# Patient Record
Sex: Female | Born: 1990 | Race: White | Hispanic: No | Marital: Single | Smoking: Former smoker
Health system: Southern US, Community
[De-identification: ages and names within clinical notes are randomized; demographics above are authoritative.]

## PROBLEM LIST (undated history)

## (undated) DIAGNOSIS — F329 Major depressive disorder, single episode, unspecified: Secondary | ICD-10-CM

## (undated) DIAGNOSIS — J309 Allergic rhinitis, unspecified: Secondary | ICD-10-CM

## (undated) DIAGNOSIS — F410 Panic disorder [episodic paroxysmal anxiety] without agoraphobia: Secondary | ICD-10-CM

## (undated) DIAGNOSIS — F32A Depression, unspecified: Secondary | ICD-10-CM

## (undated) DIAGNOSIS — F429 Obsessive-compulsive disorder, unspecified: Secondary | ICD-10-CM

## (undated) DIAGNOSIS — F419 Anxiety disorder, unspecified: Secondary | ICD-10-CM

## (undated) DIAGNOSIS — F909 Attention-deficit hyperactivity disorder, unspecified type: Secondary | ICD-10-CM

## (undated) HISTORY — DX: Depression, unspecified: F32.A

## (undated) HISTORY — DX: Major depressive disorder, single episode, unspecified: F32.9

## (undated) HISTORY — DX: Obsessive-compulsive disorder, unspecified: F42.9

## (undated) HISTORY — DX: Allergic rhinitis, unspecified: J30.9

## (undated) HISTORY — DX: Attention-deficit hyperactivity disorder, unspecified type: F90.9

---
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
## 2015-04-03 LAB — HM PAP SMEAR

## 2015-05-17 ENCOUNTER — Emergency Department
Admission: EM | Admit: 2015-05-17 | Discharge: 2015-05-17 | Disposition: A | Discharge status: Home / Self Care | Payer: BLUE CROSS/BLUE SHIELD | Attending: Emergency Medicine | Admitting: Emergency Medicine

## 2015-05-17 ENCOUNTER — Encounter: Payer: Self-pay | Admitting: Emergency Medicine

## 2015-05-17 DIAGNOSIS — Z880 Allergy status to penicillin: Secondary | ICD-10-CM | POA: Diagnosis not present

## 2015-05-17 DIAGNOSIS — R079 Chest pain, unspecified: Secondary | ICD-10-CM | POA: Diagnosis present

## 2015-05-17 DIAGNOSIS — F410 Panic disorder [episodic paroxysmal anxiety] without agoraphobia: Secondary | ICD-10-CM

## 2015-05-17 DIAGNOSIS — R200 Anesthesia of skin: Secondary | ICD-10-CM | POA: Insufficient documentation

## 2015-05-17 DIAGNOSIS — Z720 Tobacco use: Secondary | ICD-10-CM | POA: Insufficient documentation

## 2015-05-17 HISTORY — DX: Panic disorder (episodic paroxysmal anxiety): F41.0

## 2015-05-17 HISTORY — DX: Anxiety disorder, unspecified: F41.9

## 2015-05-17 MED ORDER — PROPRANOLOL HCL 20 MG PO TABS: 20.0000 mg | ORAL_TABLET | Freq: Three times a day (TID) | ORAL | Status: DC

## 2015-05-17 MED ORDER — PROPRANOLOL HCL 20 MG PO TABS
20.0000 mg | ORAL_TABLET | Freq: Once | ORAL | Status: AC
  Administered 2015-05-17: 20 mg via ORAL
  Filled 2015-05-17: qty 1

## 2015-05-17 NOTE — ED Notes (Signed)
MD at bedside. 

## 2015-05-17 NOTE — ED Notes (Signed)
Pt states that she is feeling much better, she denies pain and other c/o at time of d/c.

## 2015-05-17 NOTE — ED Notes (Signed)
Called and talked with Nicole Wade in pharmacy - they will be sending the propanolol

## 2015-05-17 NOTE — ED Provider Notes (Signed)
[facility] Emergency Department Provider Note  ____________________________________________  Time seen: 6:00 AM  I have reviewed the triage vital signs and the nursing notes.   HISTORY  Chief Complaint Chest Pain and Numbness    HPI Nicole Wade is a 24 y.o. female is brought to the ED for chest pain that started around 4:00 AM. 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 She has a long-standing history of anxiety that is very severe, she takes Klonopin and Xanax every day. She is also recently had a change in her hormonal birth control from Depakote to oral birth control pills. The chest pain is intermittent and sharp, lasting only a few seconds at a time. Nonradiating, not pleuritic or exertional. No diaphoresis vomiting or syncope. At the onset of the symptoms, she felt flushed and very energetic and restless.  She's been working night shifts at [employer] since July. She says that she has not had a day off in 5 weeks and worked every night since then. She also reports they worked very hard and she is frequently stressed about the amount of work she has. In order to make it through her shift and perform adequately, she is regularly been taking a weight loss supplement that contains multiple stimulants including caffeine, and alpha agonists, and yohimbine.     Past Medical History  Diagnosis Date  . Anxiety   . Panic disorder      There are no active problems to display for this patient.    Past Surgical History  Procedure Laterality Date  . Tooth extraction       Current Outpatient Rx  Name  Route  Sig  Dispense  Refill  . propranolol (INDERAL) 20 MG tablet   Oral   Take 1 tablet (20 mg total) by mouth 3 (three) times daily.   21 tablet   0    Klonopin, Xanax   Allergies Azithromycin and Penicillins   No family history on  file.  Social History Social History  Substance Use Topics  . Smoking status: Current Every Day Smoker -- 1.00 packs/day    Types: Cigarettes  . Smokeless tobacco: None  . Alcohol Use: Yes    Review of Systems  Constitutional:   No fever or chills. No weight changes Eyes:   No blurry vision or double vision.  ENT:   No sore throat. Cardiovascular:   Positive chest pain. Respiratory:   No dyspnea or cough. Gastrointestinal:   Negative for abdominal pain, vomiting and diarrhea.  No BRBPR or melena. Genitourinary:   Negative for dysuria, urinary retention, bloody urine, or difficulty urinating. Musculoskeletal:   Negative for back pain. No joint swelling or pain. Skin:   Negative for rash. Neurological:   Negative for headaches, focal weakness or numbness. Psychiatric:  Positive anxiety Endocrine:  No hot/cold intolerance, changes in energy, or sleep difficulty.  10-point ROS otherwise negative.  ____________________________________________   PHYSICAL EXAM:  VITAL SIGNS: ED Triage Vitals  Enc Vitals Group     BP 05/17/15 0546 122/100 mmHg     Pulse Rate 05/17/15 0546 85     Resp 05/17/15 0546 20     Temp 05/17/15 0546 98.1 F (36.7 C)     Temp Source 05/17/15 0546 Oral     SpO2 05/17/15 0552 99 %     Weight 05/17/15 0546 190 lb (86.183 kg)  Height 05/17/15 0546  (1.549 m)     Head Cir --      Peak Flow --      Pain Score 05/17/15 0547 5     Pain Loc --      Pain Edu? --      Excl. in GC? --      Constitutional:   Alert and oriented. Well appearing and in no distress. Eyes:   No scleral icterus. No conjunctival pallor. PERRL. EOMI ENT   Head:   Normocephalic and atraumatic.   Nose:   No congestion/rhinnorhea. No septal hematoma   Mouth/Throat:   MMM, no pharyngeal erythema. No peritonsillar mass. No uvula shift.   Neck:   No stridor. No SubQ emphysema. No meningismus. Hematological/Lymphatic/Immunilogical:   No cervical  lymphadenopathy. Cardiovascular:   RRR. Normal and symmetric distal pulses are present in all extremities. No murmurs, rubs, or gallops. Respiratory:   Normal respiratory effort without tachypnea nor retractions. Breath sounds are clear and equal bilaterally. No wheezes/rales/rhonchi. Gastrointestinal:   Soft and nontender. No distention. There is no CVA tenderness.  No rebound, rigidity, or guarding. Genitourinary:   deferred Musculoskeletal:   Nontender with normal range of motion in all extremities. No joint effusions.  No lower extremity tenderness.  No edema. Neurologic:   Normal speech and language.  CN 2-10 normal. Motor grossly intact. No pronator drift.  Normal gait. No gross focal neurologic deficits are appreciated.  Skin:    Skin is warm, dry and intact. No rash noted.  No petechiae, purpura, or bullae. Psychiatric:   Mood and affect are normal. Speech and behavior are normal. Patient exhibits appropriate insight and judgment.  ____________________________________________    LABS (pertinent positives/negatives) (all labs ordered are listed, but only abnormal results are displayed) Labs Reviewed - No data to display ____________________________________________   EKG  Interpreted by me Normal sinus rhythm rate of 85. Normal axis intervals QRS ST segments and T waves.  ____________________________________________    RADIOLOGY    ____________________________________________   PROCEDURES   ____________________________________________   INITIAL IMPRESSION / ASSESSMENT AND PLAN / ED COURSE  Pertinent labs & imaging results that were available during my care of the patient were reviewed by me and considered in my medical decision making (see chart for details).  Patient presents with anxiety attack. She likely had an adrenaline surge as well which was likely potentiated from her stimulant use. Patient refuses labs at this time. EKG is unremarkable. Exam is  reassuring. At a very low suspicion for ACS PE TAD pneumothorax carditis mediastinitis pneumonia or sepsis. No further workup necessary. We'll give her propranolol to help in addition to the Xanax she artery took. She'll follow-up with her primary care doctor within a week.     ____________________________________________   FINAL CLINICAL IMPRESSION(S) / ED DIAGNOSES  Final diagnoses:  Anxiety attack      Sharman Cheek, MD 05/17/15 [phone]

## 2015-05-17 NOTE — ED Notes (Signed)
Pt presents to ED with midsternal chest pain with arm and hand numbness, left arm pain, and left leg numbness that started about 4 am. Pt states she has a hx of anxiety and took xanax while at work when her symptoms first started but states it has not helped yet. Chest pain reproducible with palpation. Pt currently tachypneic and anxious.

## 2015-05-17 NOTE — Discharge Instructions (Signed)
You were seen in the emergency room tonight for a severe anxiety attack. This is related to your demanding work schedule and exhaustion. Please try to increase your time off and take regular breaks throughout your work shifts.  In addition to your current medicines, take propranolol as needed for severe anxiety attacks.  Panic Attacks Panic attacks are sudden, short-livedsurges of severe anxiety, fear, or discomfort. They may occur for no reason when you are relaxed, when you are anxious, or when you are sleeping. Panic attacks may occur for a number of reasons:   Healthy people occasionally have panic attacks in extreme, life-threatening situations, such as war or natural disasters. Normal anxiety is a protective mechanism of the body that helps [place] react to danger (fight or flight response).  Panic attacks are often seen with anxiety disorders, such as panic disorder, social anxiety disorder, generalized anxiety disorder, and phobias. Anxiety disorders cause excessive or uncontrollable anxiety. They may interfere with your relationships or other life activities.  Panic attacks are sometimes seen with other mental illnesses, such as depression and posttraumatic stress disorder.  Certain medical conditions, prescription medicines, and drugs of abuse can cause panic attacks. SYMPTOMS  Panic attacks start suddenly, peak within 20 minutes, and are accompanied by four or more of the following symptoms:  Pounding heart or fast heart rate (palpitations).  Sweating.  Trembling or shaking.  Shortness of breath or feeling smothered.  Feeling choked.  Chest pain or discomfort.  Nausea or strange feeling in your stomach.  Dizziness, light-headedness, or feeling like you will faint.  Chills or hot flushes.  Numbness or tingling in your lips or hands and feet.  Feeling that things are not real or feeling that you are not yourself.  Fear of losing control or going crazy.  Fear of  dying. Some of these symptoms can mimic serious medical conditions. For example, you may think you are having a heart attack. Although panic attacks can be very scary, they are not life threatening. DIAGNOSIS  Panic attacks are diagnosed through an assessment by your health care provider. Your health care provider will ask questions about your symptoms, such as where and when they occurred. Your health care provider will also ask about your medical history and use of alcohol and drugs, including prescription medicines. Your health care provider may order blood tests or other studies to rule out a serious medical condition. Your health care provider may refer you to a mental health professional for further evaluation. TREATMENT   Most healthy people who have one or two panic attacks in an extreme, life-threatening situation will not require treatment.  The treatment for panic attacks associated with anxiety disorders or other mental illness typically involves counseling with a mental health professional, medicine, or a combination of both. Your health care provider will help determine what treatment is best for you.  Panic attacks due to physical illness usually go away with treatment of the illness. If prescription medicine is causing panic attacks, talk with your health care provider about stopping the medicine, decreasing the dose, or substituting another medicine.  Panic attacks due to alcohol or drug abuse go away with abstinence. Some adults need professional help in order to stop drinking or using drugs. HOME CARE INSTRUCTIONS   Take all medicines as directed by your health care provider.   Schedule and attend follow-up visits as directed by your health care provider. It is important to keep all your appointments. SEEK MEDICAL CARE IF:  You are not  able to take your medicines as prescribed.  Your symptoms do not improve or get worse. SEEK IMMEDIATE MEDICAL CARE IF:   You experience  panic attack symptoms that are different than your usual symptoms.  You have serious thoughts about hurting yourself or others.  You are taking medicine for panic attacks and have a serious side effect. MAKE SURE YOU:  Understand these instructions.  Will watch your condition.  Will get help right away if you are not doing well or get worse. Document Released: 08/10/2005 Document Revised: 08/15/2013 Document Reviewed: 03/24/2013 [facility] Patient Information 2015 [place], [place]. This information is not intended to replace advice given to you by your health care provider. Make sure you discuss any questions you have with your health care provider.

## 2015-05-17 NOTE — ED Notes (Signed)
Pt c/o mid sternal chest pain onset 2 hours ago while at work.  She reports that she was using a piece of equipment at work and has had the same pain once in the past while using this equipment.   Pt also reports tingling sensation in left arm that started at the same time,   She had some tingling in her left leg as well but only as she arrived to the room and that has subsided now.  Pain is reproducible to palpation and movement.  Pt also reports hx of severe anxiety, but she reports this is not her typical anxiety symptoms- she has taken 1 mg of xanax PTA with no relief of pain.

## 2017-02-07 ENCOUNTER — Emergency Department
Admission: EM | Admit: 2017-02-07 | Discharge: 2017-02-07 | Disposition: A | Discharge status: Home / Self Care | Payer: Managed Care, Other (non HMO) | Attending: Student in an Organized Health Care Education/Training Program | Admitting: Student in an Organized Health Care Education/Training Program

## 2017-02-07 ENCOUNTER — Encounter: Payer: Self-pay | Admitting: Emergency Medicine

## 2017-02-07 DIAGNOSIS — F419 Anxiety disorder, unspecified: Secondary | ICD-10-CM

## 2017-02-07 DIAGNOSIS — F17210 Nicotine dependence, cigarettes, uncomplicated: Secondary | ICD-10-CM | POA: Diagnosis not present

## 2017-02-07 DIAGNOSIS — Z79899 Other long term (current) drug therapy: Secondary | ICD-10-CM | POA: Insufficient documentation

## 2017-02-07 DIAGNOSIS — M6289 Other specified disorders of muscle: Secondary | ICD-10-CM | POA: Diagnosis present

## 2017-02-07 LAB — URINALYSIS, COMPLETE (UACMP) WITH MICROSCOPIC
BACTERIA UA: NONE SEEN
Bilirubin Urine: NEGATIVE
Glucose, UA: NEGATIVE mg/dL
KETONES UR: NEGATIVE mg/dL
Leukocytes, UA: NEGATIVE
Nitrite: NEGATIVE
PROTEIN: NEGATIVE mg/dL
Specific Gravity, Urine: 1.001 — ABNORMAL LOW (ref 1.005–1.030)
Squamous Epithelial / LPF: NONE SEEN
pH: 7 (ref 5.0–8.0)

## 2017-02-07 LAB — BASIC METABOLIC PANEL
Anion gap: 7 (ref 5–15)
BUN: 7 mg/dL (ref 6–20)
CHLORIDE: 106 mmol/L (ref 101–111)
CO2: 23 mmol/L (ref 22–32)
Calcium: 8.8 mg/dL — ABNORMAL LOW (ref 8.9–10.3)
Creatinine, Ser: 0.85 mg/dL (ref 0.44–1.00)
GFR calc non Af Amer: >60 mL/min (ref >60)
Glucose, Bld: 113 mg/dL — ABNORMAL HIGH (ref 65–99)
Potassium: 3.4 mmol/L — ABNORMAL LOW (ref 3.5–5.1)
SODIUM: 136 mmol/L (ref 135–145)

## 2017-02-07 LAB — CBC WITH DIFFERENTIAL/PLATELET
BASOS ABS: 0.1 10*3/uL (ref 0–0.1)
BASOS PCT: 1 %
Eosinophils Absolute: 0.2 10*3/uL (ref 0–0.7)
Eosinophils Relative: 2 %
HEMATOCRIT: 38.7 % (ref 35.0–47.0)
HEMOGLOBIN: 13.5 g/dL (ref 12.0–16.0)
Lymphocytes Relative: 26 %
Lymphs Abs: 1.8 10*3/uL (ref 1.0–3.6)
MCH: 30.3 pg (ref 26.0–34.0)
MCHC: 34.8 g/dL (ref 32.0–36.0)
MCV: 87.1 fL (ref 80.0–100.0)
Monocytes Absolute: 0.4 10*3/uL (ref 0.2–0.9)
Monocytes Relative: 6 %
NEUTROS ABS: 4.6 10*3/uL (ref 1.4–6.5)
NEUTROS PCT: 65 %
Platelets: 263 10*3/uL (ref 150–440)
RBC: 4.45 MIL/uL (ref 3.80–5.20)
RDW: 12.9 % (ref 11.5–14.5)
WBC: 7 10*3/uL (ref 3.6–11.0)

## 2017-02-07 LAB — HEPATIC FUNCTION PANEL
ALBUMIN: 4.2 g/dL (ref 3.5–5.0)
ALK PHOS: 46 U/L (ref 38–126)
ALT: 15 U/L (ref 14–54)
AST: 22 U/L (ref 15–41)
BILIRUBIN TOTAL: 0.5 mg/dL (ref 0.3–1.2)
Bilirubin, Direct: <0.1 mg/dL — ABNORMAL LOW (ref 0.1–0.5)
TOTAL PROTEIN: 7.3 g/dL (ref 6.5–8.1)

## 2017-02-07 LAB — POCT PREGNANCY, URINE: Preg Test, Ur: NEGATIVE

## 2017-02-07 LAB — GLUCOSE, CAPILLARY: Glucose-Capillary: 118 mg/dL — ABNORMAL HIGH (ref 65–99)

## 2017-02-07 LAB — PREGNANCY, URINE: PREG TEST UR: NEGATIVE

## 2017-02-07 MED ORDER — LORAZEPAM 2 MG PO TABS
2.0000 mg | ORAL_TABLET | Freq: Once | ORAL | Status: AC
  Administered 2017-02-07: 2 mg via ORAL
  Filled 2017-02-07: qty 1

## 2017-02-07 NOTE — ED Triage Notes (Signed)
States about one hour ago began feeling anxious.  Patient states "I don't feel right".  Patient took Xanax 1 mg about 1 hour PTA with no effect.

## 2017-02-07 NOTE — ED Notes (Signed)

## 2017-02-07 NOTE — ED Notes (Signed)
Pt given graham crackers and ginger ale for PO challenge °

## 2017-02-07 NOTE — ED Provider Notes (Signed)
[facility] Emergency Department Provider Note    None    (approximate)  I have reviewed the triage vital signs and the nursing notes.   HISTORY  Chief Complaint Panic Attack    HPI Nicole Wade is a 26 y.o. female presents with chief complaint of feeling severely anxious and having a generalized sensation of overall tightness in her body that occurred roughly 1 hour prior to arrival. Patient took 1 mg of Xanax that she has for when necessary without improvement. States that she was feeling overwhelming sense of [place] and feeling jittery. Denies any recent stressors but has a long history of chronic anxiety and her primary doctor is trying to wean her off of her Klonopin that she's been taking since she is 26 years old.  Denies any abdominal pain. Negative dysuria. No pain when taking deep breath. No numbness or tingling.   Past Medical History:  Diagnosis Date  . Anxiety   . Panic disorder    No family history on file. Past Surgical History:  Procedure Laterality Date  . TOOTH EXTRACTION     There are no active problems to display for this patient.     Prior to Admission medications   Medication Sig Start Date End Date Taking? Authorizing Provider  propranolol (INDERAL) 20 MG tablet Take 1 tablet (20 mg total) by mouth 3 (three) times daily. 05/17/15   Sharman Cheek, MD    Allergies Azithromycin; Penicillins; and Sulfa antibiotics    Social History Social History  Substance Use Topics  . Smoking status: Current Every Day Smoker    Packs/day: 1.00    Types: Cigarettes  . Smokeless tobacco: Never Used  . Alcohol use Yes    Review of Systems Patient denies headaches, rhinorrhea, blurry vision, numbness, shortness of breath, chest pain, edema, cough, abdominal pain, nausea, vomiting, diarrhea, dysuria, fevers, rashes or hallucinations unless otherwise stated above in HPI. ____________________________________________   PHYSICAL  EXAM:  VITAL SIGNS: Vitals:   02/07/17 1800 02/07/17 1830  BP: 107/71 113/77  Pulse: 65 (!) 55  Resp: 18 16  Temp:      Constitutional: Alert and oriented. Severely anxious appearing but able to ambulate into the ER with a steady gait in no acute distress. Eyes: Conjunctivae are normal.  Head: Atraumatic. Nose: No congestion/rhinnorhea. Mouth/Throat: Mucous membranes are moist.   Neck: No stridor. Painless ROM.  Cardiovascular: Normal rate, regular rhythm. Grossly normal heart sounds.  Good peripheral circulation. Respiratory: Normal respiratory effort.  No retractions. Lungs CTAB. Gastrointestinal: Soft and nontender. No distention. No abdominal bruits. No CVA tenderness. Musculoskeletal: No lower extremity tenderness nor edema.  No joint effusions. Neurologic:  Normal speech and language. No gross focal neurologic deficits are appreciated. No facial droop Skin:  Skin is warm, dry and intact. No rash noted. Psychiatric: anxious appearing, no SI or HI ____________________________________________   LABS (all labs ordered are listed, but only abnormal results are displayed)  Results for orders placed or performed during the hospital encounter of 02/07/17 (from the past 24 hour(s))  Glucose, capillary     Status: Abnormal   Collection Time: 02/07/17  4:33 PM  Result Value Ref Range   Glucose-Capillary 118 (H) 65 - 99 mg/dL  CBC with Differential/Platelet     Status: None   Collection Time: 02/07/17  4:34 PM  Result Value Ref Range   WBC 7.0 3.6 - 11.0 K/uL   RBC 4.45 3.80 - 5.20 MIL/uL   Hemoglobin 13.5 12.0 - 16.0  g/dL   HCT 16.138.7 09.635.0 - 04.547.0 %   MCV 87.1 80.0 - 100.0 fL   MCH 30.3 26.0 - 34.0 pg   MCHC 34.8 32.0 - 36.0 g/dL   RDW 40.912.9 81.111.5 - 91.414.5 %   Platelets 263 150 - 440 K/uL   Neutrophils Relative % 65 %   Neutro Abs 4.6 1.4 - 6.5 K/uL   Lymphocytes Relative 26 %   Lymphs Abs 1.8 1.0 - 3.6 K/uL   Monocytes Relative 6 %   Monocytes Absolute 0.4 0.2 - 0.9 K/uL    Eosinophils Relative 2 %   Eosinophils Absolute 0.2 0 - 0.7 K/uL   Basophils Relative 1 %   Basophils Absolute 0.1 0 - 0.1 K/uL  Basic metabolic panel     Status: Abnormal   Collection Time: 02/07/17  4:34 PM  Result Value Ref Range   Sodium 136 135 - 145 mmol/L   Potassium 3.4 (L) 3.5 - 5.1 mmol/L   Chloride 106 101 - 111 mmol/L   CO2 23 22 - 32 mmol/L   Glucose, Bld 113 (H) 65 - 99 mg/dL   BUN 7 6 - 20 mg/dL   Creatinine, Ser 7.820.85 0.44 - 1.00 mg/dL   Calcium 8.8 (L) 8.9 - 10.3 mg/dL   GFR calc non Af Amer >60 >60 mL/min   GFR calc Af Amer >60 >60 mL/min   Anion gap 7 5 - 15  Hepatic function panel     Status: Abnormal   Collection Time: 02/07/17  4:34 PM  Result Value Ref Range   Total Protein 7.3 6.5 - 8.1 g/dL   Albumin 4.2 3.5 - 5.0 g/dL   AST 22 15 - 41 U/L   ALT 15 14 - 54 U/L   Alkaline Phosphatase 46 38 - 126 U/L   Total Bilirubin 0.5 0.3 - 1.2 mg/dL   Bilirubin, Direct <9.5<0.1 (L) 0.1 - 0.5 mg/dL   Indirect Bilirubin NOT CALCULATED 0.3 - 0.9 mg/dL  Urinalysis, Complete w Microscopic     Status: Abnormal   Collection Time: 02/07/17  4:34 PM  Result Value Ref Range   Color, Urine STRAW (A) YELLOW   APPearance CLEAR (A) CLEAR   Specific Gravity, Urine 1.001 (L) 1.005 - 1.030   pH 7.0 5.0 - 8.0   Glucose, UA NEGATIVE NEGATIVE mg/dL   Hgb urine dipstick MODERATE (A) NEGATIVE   Bilirubin Urine NEGATIVE NEGATIVE   Ketones, ur NEGATIVE NEGATIVE mg/dL   Protein, ur NEGATIVE NEGATIVE mg/dL   Nitrite NEGATIVE NEGATIVE   Leukocytes, UA NEGATIVE NEGATIVE   RBC / HPF 0-5 0 - 5 RBC/hpf   WBC, UA 0-5 0 - 5 WBC/hpf   Bacteria, UA NONE SEEN NONE SEEN   Squamous Epithelial / LPF NONE SEEN NONE SEEN   Mucous PRESENT   Pregnancy, urine     Status: None   Collection Time: 02/07/17  4:34 PM  Result Value Ref Range   Preg Test, Ur NEGATIVE NEGATIVE  Pregnancy, urine POC     Status: None   Collection Time: 02/07/17  4:48 PM  Result Value Ref Range   Preg Test, Ur NEGATIVE  NEGATIVE   ____________________________________________  EKG My review and personal interpretation at Time: 16:58   Indication: sinus  Rate: 70  Rhythm: sinus Axis: normal Other: normal interals, no wpw or brugada ____________________________________________  RADIOLOGY ____________________________________________   PROCEDURES  Procedure(s) performed:  Procedures    Critical Care performed: no ____________________________________________   INITIAL IMPRESSION / ASSESSMENT AND  PLAN / ED COURSE  Pertinent labs & imaging results that were available during my care of the patient were reviewed by me and considered in my medical decision making (see chart for details).  DDX: anxiety, panic attack, dehydration, electrolyte abn  Nicole Wade is a 26 y.o. who presents to the ED with history of panic disorder presenting with above symptoms. She is afebrile and otherwise well appearing. Does seem to be having significant symptoms secondary to severe distress. We'll provide anxiolysis but will also further evaluate for any acute metabolic derangement with laboratory evaluation.  Blood work is reassuring. EKG shows no changes.  Clinical Course as of Feb 07 2114  Wynelle Link Feb 07, 2017  1824 Symptoms improved.  Patient was able to tolerate PO and was able to ambulate with a steady gait.  Have discussed with the patient and available family all diagnostics and treatments performed thus far and all questions were answered to the best of my ability. The patient demonstrates understanding and agreement with plan.    [PR]    Clinical Course User Index [PR] Willy Eddy, MD     ____________________________________________   FINAL CLINICAL IMPRESSION(S) / ED DIAGNOSES  Final diagnoses:  Anxiety      NEW MEDICATIONS STARTED DURING THIS VISIT:  Discharge Medication List as of 02/07/2017  6:33 PM       Note:  This document was prepared using Dragon voice recognition software and  may include unintentional dictation errors.    Willy Eddy, MD 02/07/17 2115

## 2017-05-04 ENCOUNTER — Emergency Department
Admission: EM | Admit: 2017-05-04 | Discharge: 2017-05-04 | Disposition: A | Discharge status: Home / Self Care | Payer: BLUE CROSS/BLUE SHIELD | Attending: Emergency Medicine | Admitting: Emergency Medicine

## 2017-05-04 DIAGNOSIS — Z79899 Other long term (current) drug therapy: Secondary | ICD-10-CM | POA: Insufficient documentation

## 2017-05-04 DIAGNOSIS — F17210 Nicotine dependence, cigarettes, uncomplicated: Secondary | ICD-10-CM | POA: Insufficient documentation

## 2017-05-04 DIAGNOSIS — F410 Panic disorder [episodic paroxysmal anxiety] without agoraphobia: Secondary | ICD-10-CM | POA: Insufficient documentation

## 2017-05-04 LAB — GLUCOSE, CAPILLARY: GLUCOSE-CAPILLARY: 97 mg/dL (ref 65–99)

## 2017-05-04 MED ORDER — LORAZEPAM 1 MG PO TABS: 2.0000 mg | ORAL_TABLET | Freq: Once | ORAL | Status: DC

## 2017-05-04 NOTE — Discharge Instructions (Signed)
Eat a meal after you leave the Emergency Department this evening.  Practice relaxation techniques as you discussed were beneficial.   Take your scheduled nightly clonopin once you are home.   If symptoms significantly worsen do not hesitate to return to the emergency department.

## 2017-05-04 NOTE — ED Triage Notes (Addendum)
Pt arrives to ED via POV with c/o anxiety. Pt reports taking 0.5mg  Xanax 30 mins PTA without relief. Pt denies any recent stress or cause for her s/x's. Pt states she "lives alone and had no one around to help her" so she came to the ED for treatment. Pt denies SI/HI or AVH. Pt is A&O, in NAD; RR even, regular, and unlabored.

## 2017-05-04 NOTE — ED Provider Notes (Signed)
[facility] Emergency Department Provider Note   ____________________________________________   I have reviewed the triage vital signs and the nursing notes.   HISTORY  Chief Complaint Anxiety    HPI Nicole Wade is a 26 y.o. female presents with feelings of severe anxiety, tightness all over her body and something being wrong with her thyroid. Patient took 0.5 mg of Xanax without improvement. She lives alone and when the anxiety do not improve after taking the Xanax she came to the emergency department. Patient reports current stressors concern something is wrong with her thyroid and exposure to black mold in her home and at work. Her primary care doctor is aware of the thyroid concern however she has not been able to follow-up to discuss black mold exposure. Patient has talked with her apartment complex regarding black mold exposure. Patient reports tightness in the chest however denies chest complaints with taking a deep breath. Patient reports recovering from a recent sinus infection. Patient denies numbness and tingling of the extremities No pain when taking deep breath. No numbness or tingling.Patient denies fever, chills, headache, vision changes, shortness of breath, abdominal pain, nausea and vomiting.  Past Medical History:  Diagnosis Date  . Anxiety   . Panic disorder     There are no active problems to display for this patient.   Past Surgical History:  Procedure Laterality Date  . TOOTH EXTRACTION      Prior to Admission medications   Medication Sig Start Date End Date Taking? Authorizing Provider  propranolol (INDERAL) 20 MG tablet Take 1 tablet (20 mg total) by mouth 3 (three) times daily. 05/17/15   Sharman CheekStafford, Phillip, MD    Allergies Azithromycin; Penicillins; and Sulfa antibiotics  No family history on file.  Social History Social History  Substance Use Topics  . Smoking status: Current Every Day Smoker    Packs/day: 1.00   Types: Cigarettes  . Smokeless tobacco: Never Used  . Alcohol use Yes    Review of Systems Constitutional: Negative for fever/chills. Anxious and distressed. Eyes: No visual changes. ENT:  Negative for sore throat and for difficulty swallowing Cardiovascular: Positive for chest pain and tightness. Respiratory: Denies cough. Denies shortness of breath. Gastrointestinal: No abdominal pain.  Positive for nausea. No vomiting, diarrhea. Genitourinary: Negative for dysuria. Musculoskeletal: Negative for back pain. Skin: Negative for rash. Neurological: Negative for headaches.  Negative focal weakness or numbness. Negative for loss of consciousness. Able to ambulate. Psychiatric: Anxious and concerned about black mold, the weather and her thyroid. ____________________________________________   PHYSICAL EXAM:  VITAL SIGNS: ED Triage Vitals  Enc Vitals Group     BP 05/04/17 2143 111/67     Pulse Rate 05/04/17 2143 (!) 103     Resp 05/04/17 2143 18     Temp 05/04/17 2143 98.9 F (37.2 C)     Temp Source 05/04/17 2143 Oral     SpO2 05/04/17 2143 99 %     Weight 05/04/17 2144 185 lb (83.9 kg)     Height 05/04/17 2144 5\' 1"  (1.549 m)     Head Circumference --      Peak Flow --      Pain Score 05/04/17 2143 0     Pain Loc --      Pain Edu? --      Excl. in GC? --     Constitutional: Alert and oriented. Well appearing. Anxious and moderate distress.  Eyes: Conjunctivae are normal. PERRL. EOMI  Head: Normocephalic and atraumatic.  Neck:Supple. No thyromegaly. No stridor.  Cardiovascular: Normal rate, regular rhythm. Normal S1 and S2.  Good peripheral circulation. Respiratory: Normal respiratory effort without tachypnea or retractions. Lungs CTAB. No wheezes/rales/rhonchi. Good air entry to the bases with no decreased or absent breath sounds. Hematological/Lymphatic/Immunological: No cervical lymphadenopathy. Cardiovascular: Normal rate, regular rhythm. Normal distal  pulses. Gastrointestinal: Bowel sounds 4 quadrants. Soft and nontender to palpation. No guarding or rigidity. No palpable masses. No distention. No CVA tenderness. Musculoskeletal: Nontender with normal range of motion in all extremities. Neurologic: Normal speech and language. No gross focal neurologic deficits are appreciated. No gait instability. Cranial nerves: II-X intact. No sensory loss or abnormal reflexes.  Skin:  Skin is warm, dry and intact. No rash noted. Psychiatric: Mood and affect are anxious with heightened sense of concern. Speech normal. No SI or HI.  ____________________________________________   LABS (all labs ordered are listed, but only abnormal results are displayed)  Labs Reviewed  GLUCOSE, CAPILLARY  CBG MONITORING, ED   ____________________________________________  EKG Indication: sinus  Rate: 87  Rhythm: sinus Axis: normal No acute ST elevation changes.   ____________________________________________  RADIOLOGY none ____________________________________________   PROCEDURES  Procedure(s) performed: no    Critical Care performed: no ____________________________________________   INITIAL IMPRESSION / ASSESSMENT AND PLAN / ED COURSE  Pertinent labs & imaging results that were available during my care of the patient were reviewed by me and considered in my medical decision making (see chart for details).  Patient presents to emergency department with anxiety. History and physical exam findings were reassuring symptoms are consistent with acute anxiety attack. Symptoms improved while assessed and treated in the emergency department. Vital signs, point-of-care CBG and 12-lead EKG were stable and unremarkable. Patient's anxiety level lessened when she became knowledgeable of test and assessment results while being treated emergency department. Recommended she continue relaxation techniques, eat a meal and take her evening Klonopin dose once she returns  home. Advised her if symptoms significantly worsen do not hesitate to return to the emergency department otherwise highly recommended she follow up with her PCP. Patient informed of clinical course, understand medical decision-making process, and agree with plan.  ____________________________________________   FINAL CLINICAL IMPRESSION(S) / ED DIAGNOSES  Final diagnoses:  Anxiety attack       NEW MEDICATIONS STARTED DURING THIS VISIT:  Discharge Medication List as of 05/04/2017 10:53 PM       Note:  This document was prepared using Dragon voice recognition software and may include unintentional dictation errors.    Clois Comber, PA-C 05/04/17 2324    Jeanmarie Plant, MD 05/05/17 [phone]

## 2017-05-25 ENCOUNTER — Encounter: Payer: Self-pay | Admitting: Family Medicine

## 2017-05-25 ENCOUNTER — Ambulatory Visit (INDEPENDENT_AMBULATORY_CARE_PROVIDER_SITE_OTHER): Payer: 59 | Admitting: Family Medicine

## 2017-05-25 VITALS — BP 116/80 | HR 80 | Temp 98.2°F | Resp 16 | Ht 61.0 in | Wt 191.0 lb

## 2017-05-25 DIAGNOSIS — J3089 Other allergic rhinitis: Secondary | ICD-10-CM | POA: Diagnosis not present

## 2017-05-25 DIAGNOSIS — F329 Major depressive disorder, single episode, unspecified: Secondary | ICD-10-CM | POA: Diagnosis not present

## 2017-05-25 DIAGNOSIS — Z8349 Family history of other endocrine, nutritional and metabolic diseases: Secondary | ICD-10-CM | POA: Insufficient documentation

## 2017-05-25 DIAGNOSIS — F429 Obsessive-compulsive disorder, unspecified: Secondary | ICD-10-CM

## 2017-05-25 DIAGNOSIS — F410 Panic disorder [episodic paroxysmal anxiety] without agoraphobia: Secondary | ICD-10-CM

## 2017-05-25 DIAGNOSIS — Z7689 Persons encountering health services in other specified circumstances: Secondary | ICD-10-CM | POA: Diagnosis not present

## 2017-05-25 DIAGNOSIS — Z114 Encounter for screening for human immunodeficiency virus [HIV]: Secondary | ICD-10-CM

## 2017-05-25 DIAGNOSIS — F419 Anxiety disorder, unspecified: Secondary | ICD-10-CM

## 2017-05-25 DIAGNOSIS — F909 Attention-deficit hyperactivity disorder, unspecified type: Secondary | ICD-10-CM | POA: Diagnosis not present

## 2017-05-25 DIAGNOSIS — J309 Allergic rhinitis, unspecified: Secondary | ICD-10-CM | POA: Insufficient documentation

## 2017-05-25 DIAGNOSIS — F32A Depression, unspecified: Secondary | ICD-10-CM | POA: Insufficient documentation

## 2017-05-25 NOTE — Assessment & Plan Note (Signed)
Check TSH given family history of thyroid disease Thyroid is normal to palpation on exam today

## 2017-05-25 NOTE — Assessment & Plan Note (Addendum)
Uncontrolled, see GAD 7 score Patient is taking Xanax every day for "breakthrough "anxiety She has initial visit with psychiatry tomorrow to start therapy as well as medication management No changes in management at this time, but discussed with patient at length that a higher dose of an SSRI would be helpful for baseline anxiety Also discussed the risks of benzos, especially in combination as she is taking currently Contracted for safety We will plan a follow-up after she establishes psychiatry

## 2017-05-25 NOTE — Assessment & Plan Note (Signed)
Patient is currently holding Adderall as it seems to be exacerbating her anxiety Advised patient to continue doing this  she will discuss with psychiatry at her upcoming visit tomorrow

## 2017-05-25 NOTE — Assessment & Plan Note (Signed)
Uncontrolled Treated with Prozac at suboptimal dose Long discussion with the patient about how an optimal dose of SSRI be helpful to manage symptoms She has an appointment with psychiatry tomorrow to establish care Will allow psychiatry to continue medical management and therapy - appreciate assistance

## 2017-05-25 NOTE — Assessment & Plan Note (Addendum)
Uncontrolled, see PHQ9 score She has initial visit with psychiatry tomorrow to start therapy as well as medication management No changes in management at this time, but discussed with patient at length that a higher dose of an SSRI would be helpful for baseline anxiety and depression Patient adamantly denies SI/HI, contracted for safety We will plan one month follow-up after she establishes psychiatry

## 2017-05-25 NOTE — Assessment & Plan Note (Signed)
Stable without medications for treatment at this time Could consider oral antihistamine versus Flonase in the future, the patient is hesitant to take more medications

## 2017-05-25 NOTE — Patient Instructions (Signed)
Major Depressive Disorder, Adult Major depressive disorder (MDD) is a mental health condition. It may also be called clinical depression or unipolar depression. MDD usually causes feelings of sadness, hopelessness, or helplessness. MDD can also cause physical symptoms. It can interfere with work, school, relationships, and other everyday activities. MDD may be mild, moderate, or severe. It may occur once (single episode major depressive disorder) or it may occur multiple times (recurrent major depressive disorder). What are the causes? The exact cause of this condition is not known. MDD is most likely caused by a combination of things, which may include:  Genetic factors. These are traits that are passed along from parent to child.  Individual factors. Your personality, your behavior, and the way you handle your thoughts and feelings may contribute to MDD. This includes personality traits and behaviors learned from others.  Physical factors, such as: ? Differences in the part of your brain that controls emotion. This part of your brain may be different than it is in people who do not have MDD. ? Long-term (chronic) medical or psychiatric illnesses.  Social factors. Traumatic experiences or major life changes may play a role in the development of MDD.  What increases the risk? This condition is more likely to develop in women. The following factors may also make you more likely to develop MDD:  A family history of depression.  Troubled family relationships.  Abnormally low levels of certain brain chemicals.  Traumatic events in childhood, especially abuse or the loss of a parent.  Being under a lot of stress, or long-term stress, especially from upsetting life experiences or losses.  A history of: ? Chronic physical illness. ? Other mental health disorders. ? Substance abuse.  Poor living conditions.  Experiencing social exclusion or discrimination on a regular basis.  What are  the signs or symptoms? The main symptoms of MDD typically include:  Constant depressed or irritable mood.  Loss of interest in things and activities.  MDD symptoms may also include:  Sleeping or eating too much or too little.  Unexplained weight change.  Fatigue or low energy.  Feelings of worthlessness or guilt.  Difficulty thinking clearly or making decisions.  Thoughts of suicide or of harming others.  Physical agitation or weakness.  Isolation.  Severe cases of MDD may also occur with other symptoms, such as:  Delusions or hallucinations, in which you imagine things that are not real (psychotic depression).  Low-level depression that lasts at least a year (chronic depression or persistent depressive disorder).  Extreme sadness and hopelessness (melancholic depression).  Trouble speaking and moving (catatonic depression).  How is this diagnosed? This condition may be diagnosed based on:  Your symptoms.  Your medical history, including your mental health history. This may involve tests to evaluate your mental health. You may be asked questions about your lifestyle, including any drug and alcohol use, and how long you have had symptoms of MDD.  A physical exam.  Blood tests to rule out other conditions.  You must have a depressed mood and at least four other MDD symptoms most of the day, nearly every day in the same 2-week timeframe before your health care provider can confirm a diagnosis of MDD. How is this treated? This condition is usually treated by mental health professionals, such as psychologists, psychiatrists, and clinical social workers. You may need more than one type of treatment. Treatment may include:  Psychotherapy. This is also called talk therapy or counseling. Types of psychotherapy include: ? Cognitive behavioral   therapy (CBT). This type of therapy teaches you to recognize unhealthy feelings, thoughts, and behaviors, and replace them with  positive thoughts and actions. ? Interpersonal therapy (IPT). This helps you to improve the way you relate to and communicate with others. ? Family therapy. This treatment includes members of your family.  Medicine to treat anxiety and depression, or to help you control certain emotions and behaviors.  Lifestyle changes, such as: ? Limiting alcohol and drug use. ? Exercising regularly. ? Getting plenty of sleep. ? Making healthy eating choices. ? Spending more time outdoors.  Treatments involving stimulation of the brain can be used in situations with extremely severe symptoms, or when medicine or other therapies do not work over time. These treatments include electroconvulsive therapy, transcranial magnetic stimulation, and vagal nerve stimulation. Follow these instructions at home: Activity  Return to your normal activities as told by your health care provider.  Exercise regularly and spend time outdoors as told by your health care provider. General instructions  Take over-the-counter and prescription medicines only as told by your health care provider.  Do not drink alcohol. If you drink alcohol, limit your alcohol intake to no more than 1 drink a day for nonpregnant women and 2 drinks a day for men. One drink equals 12 oz of beer, 5 oz of wine, or 1 oz of hard liquor. Alcohol can affect any antidepressant medicines you are taking. Talk to your health care provider about your alcohol use.  Eat a healthy diet and get plenty of sleep.  Find activities that you enjoy doing, and make time to do them.  Consider joining a support group. Your health care provider may be able to recommend a support group.  Keep all follow-up visits as told by your health care provider. This is important. Where to find more information: National Alliance on Mental Illness  www.nami.org  [place] National Institute of Mental Health  www.nimh.nih.gov  National Suicide Prevention  Lifeline  1-800-273-TALK (8255). This is free, 24-hour help.  Contact a health care provider if:  Your symptoms get worse.  You develop new symptoms. Get help right away if:  You self-harm.  You have serious thoughts about hurting yourself or others.  You see, hear, taste, smell, or feel things that are not present (hallucinate). This information is not intended to replace advice given to you by your health care provider. Make sure you discuss any questions you have with your health care provider. Document Released: 12/05/2012 Document Revised: 04/16/2016 Document Reviewed: 02/19/2016 Elsevier Interactive Patient Education  2017 [employer]  

## 2017-05-25 NOTE — Assessment & Plan Note (Signed)
Related to uncontrolled anxiety Patient is taking Xanax every day for "breakthrough "anxiety She has initial visit with psychiatry tomorrow to start therapy as well as medication management No changes in management at this time, but discussed with patient at length that a higher dose of an SSRI would be helpful for baseline anxiety Also discussed the risks of benzos, especially in combination as she is taking currently We will follow-up in one month, but will allow psychiatry to continue medication management

## 2017-05-25 NOTE — Progress Notes (Signed)
Patient: Nicole Wade Female    DOB: 1991-04-25   26 y.o.   MRN: [id number] Visit Date: 05/25/2017  Today's Provider: Shirlee Latch, MD   Chief Complaint  Patient presents with  . Establish Care   Subjective:    HPI   Nicole Wade presents to establish care. She moved to area form [place] about 3 years ago. She has been seeing [facility] as her primary since that time. She has a H/O anxiety, depression, OCD, ADHD and seasonal allergies. She is taking fluoxetine 20 mg, as well as clonazepam 1.5 mg qhs, and is having to take Alprazolam 0.5 mg PRN for breakthrough anxiety. She states she is needing to take the alprazolam daily. "I'm still waking up in the middle of the night". She is c/o GI issues, including diarrhea, nausea, vomiting with burping. She states this occurs "all the time", and is not attributed to eating. She believes this is caused by her anxiety. She has an appointment with psych tomorrow (inital appointment).   Has not been taking Adderall because it feels like it spikes anxiety. She states that she wishes to get off of these medications. She is afraid of dying from these medications as multiple friends of hers from [place] have died from drug overdoses recently.  Marijuana use occasionally in the middle of the night to help with excessive anxiety. She reports that she smokes too much, however, she will feel anxious. States that her mother and her Brother both use medical marijuana for their anxiety as well  Allergic rhinitis: found black mold in her apartment and wonders if this contributes.  Not taking any medications and not interested in more meds.  Tobacco use - craves very seldomly.  Has cut back from 1ppd to 1 cigarette daily. Wanting to quit and knows the health risks of continuing to smoke  Depression screen [facility] 2/9 05/25/2017  Decreased Interest 3  Down, Depressed, Hopeless 3  PHQ - 2 Score 6  Altered sleeping 3  Tired, decreased energy 3  Change in  appetite 3  Feeling bad or failure about yourself  3  Trouble concentrating 3  Moving slowly or fidgety/restless 2  Suicidal thoughts 0  PHQ-9 Score 23  Difficult doing work/chores Extremely dIfficult   GAD 7 : Generalized Anxiety Score 05/25/2017  Nervous, Anxious, on Edge 3  Control/stop worrying 3  Worry too much - different things 3  Trouble relaxing 3  Restless 3  Easily annoyed or irritable 3  Afraid - awful might happen 3  Total GAD 7 Score 21  Anxiety Difficulty Somewhat difficult      Allergies  Allergen Reactions  . Azithromycin Hives  . Penicillins Hives  . Sulfa Antibiotics Hives     Current Outpatient Prescriptions:  .  ALPRAZolam (XANAX) 0.5 MG tablet, Take 1 tablet by mouth 2 (two) times daily as needed., Disp: , Rfl: 0 .  amphetamine-dextroamphetamine (ADDERALL) 10 MG tablet, Take 10 mg by mouth 2 (two) times daily., Disp: , Rfl: 0 .  clonazePAM (KLONOPIN) 1 MG tablet, Take 1.5 tablets by mouth at bedtime., Disp: , Rfl: 0 .  FLUoxetine (PROZAC) 20 MG capsule, Take 20 mg by mouth every morning., Disp: , Rfl: 0 .  Probiotic Product (PROBIOTIC ADVANCED PO), Take by mouth., Disp: , Rfl:   Review of Systems  Constitutional: Positive for activity change, diaphoresis and fatigue.  HENT: Positive for dental problem, ear discharge, rhinorrhea, sinus pain and sore throat.   Respiratory:  Positive for chest tightness and shortness of breath.   Cardiovascular: Positive for chest pain.  Gastrointestinal: Positive for abdominal distention, abdominal pain and nausea.  Musculoskeletal: Positive for back pain, neck pain and neck stiffness.  Allergic/Immunologic: Positive for environmental allergies.  Neurological: Positive for light-headedness and headaches.  Psychiatric/Behavioral: Positive for agitation, decreased concentration, dysphoric mood and sleep disturbance. The patient is nervous/anxious.   All other systems reviewed and are negative.  Past Medical History:    Diagnosis Date  . ADHD   . Allergic rhinitis   . Anxiety   . Depression   . OCD (obsessive compulsive disorder)   . Panic disorder    Past Surgical History:  Procedure Laterality Date  . TOOTH EXTRACTION  2010   wisdom teeth   Family History  Problem Relation Age of Onset  . Hypothyroidism Mother   . Anxiety disorder Mother   . Depression Mother   . Alcohol abuse Mother   . Depression Father   . Anxiety disorder Father   . Hypertension Father   . Anxiety disorder Brother   . Depression Brother   . Heart attack Maternal Grandfather 65  . CVA Paternal Grandfather 16  . Heart attack Paternal Grandfather 24  . Diabetes Paternal Grandfather   . Breast cancer Paternal Aunt   . Colon cancer Neg Hx   . Cervical cancer Neg Hx     Social History  Substance Use Topics  . Smoking status: Current Every Day Smoker    Packs/day: 0.25    Types: Cigarettes  . Smokeless tobacco: Former Neurosurgeon    Types: Snuff    Quit date: 03/23/2016     Comment: started smoking at age 61; states she is smoking about 1 cigarette daily. "I no longer crave them"  . Alcohol use No   Objective:   BP 116/80 (BP Location: Left Arm, Patient Position: Sitting, Cuff Size: Large)   Pulse 80   Temp 98.2 F (36.8 C) (Oral)   Resp 16   Ht  (1.549 m)   Wt 191 lb (86.6 kg)   LMP 05/23/2017   BMI 36.09 kg/m  Vitals:   05/25/17 0912  BP: 116/80  Pulse: 80  Resp: 16  Temp: 98.2 F (36.8 C)  TempSrc: Oral  Weight: 191 lb (86.6 kg)  Height:  (1.549 m)     Physical Exam  Constitutional: She is oriented to person, place, and time. She appears well-developed and well-nourished. No distress.  HENT:  Head: Normocephalic and atraumatic.  Right Ear: External ear normal.  Left Ear: External ear normal.  Nose: Nose normal.  Mouth/Throat: Oropharynx is clear and moist. No oropharyngeal exudate.  Eyes: Pupils are equal, round, and reactive to light. Conjunctivae are normal. No scleral icterus.   Neck: Neck supple. No thyromegaly present.  Cardiovascular: Normal rate, regular rhythm, normal heart sounds and intact distal pulses.   No murmur heard. Pulmonary/Chest: Effort normal and breath sounds normal. No respiratory distress. She has no wheezes. She has no rales.  Abdominal: Soft. Bowel sounds are normal. She exhibits no distension. There is no tenderness. There is no rebound and no guarding.  Musculoskeletal: She exhibits no edema or deformity.  Lymphadenopathy:    She has no cervical adenopathy.  Neurological: She is alert and oriented to person, place, and time.  Skin: Skin is warm and dry. No rash noted.  Psychiatric:  Intermittently tearful, anxious. Appropriate contrast. No evidence of AVH.  Vitals reviewed.      Assessment &  Plan:      Problem List Items Addressed This Visit      Respiratory   Allergic rhinitis    Stable without medications for treatment at this time Could consider oral antihistamine versus Flonase in the future, the patient is hesitant to take more medications        Other   Panic disorder    Related to uncontrolled anxiety Patient is taking Xanax every day for "breakthrough "anxiety She has initial visit with psychiatry tomorrow to start therapy as well as medication management No changes in management at this time, but discussed with patient at length that a higher dose of an SSRI would be helpful for baseline anxiety Also discussed the risks of benzos, especially in combination as she is taking currently We will follow-up in one month, but will allow psychiatry to continue medication management      Relevant Medications   FLUoxetine (PROZAC) 20 MG capsule   ALPRAZolam (XANAX) 0.5 MG tablet   OCD (obsessive compulsive disorder)    Uncontrolled Treated with Prozac at suboptimal dose Long discussion with the patient about how an optimal dose of SSRI be helpful to manage symptoms She has an appointment with psychiatry tomorrow to  establish care Will allow psychiatry to continue medical management and therapy - appreciate assistance      Relevant Medications   FLUoxetine (PROZAC) 20 MG capsule   ALPRAZolam (XANAX) 0.5 MG tablet   Depression    Uncontrolled, see PHQ9 score She has initial visit with psychiatry tomorrow to start therapy as well as medication management No changes in management at this time, but discussed with patient at length that a higher dose of an SSRI would be helpful for baseline anxiety and depression Patient adamantly denies SI/HI, contracted for safety We will plan one month follow-up after she establishes psychiatry      Relevant Medications   FLUoxetine (PROZAC) 20 MG capsule   ALPRAZolam (XANAX) 0.5 MG tablet   Other Relevant Orders   CBC w/Diff/Platelet   TSH   Comprehensive metabolic panel   Anxiety    Uncontrolled, see GAD 7 score Patient is taking Xanax every day for "breakthrough "anxiety She has initial visit with psychiatry tomorrow to start therapy as well as medication management No changes in management at this time, but discussed with patient at length that a higher dose of an SSRI would be helpful for baseline anxiety Also discussed the risks of benzos, especially in combination as she is taking currently Contracted for safety We will plan a follow-up after she establishes psychiatry      Relevant Medications   FLUoxetine (PROZAC) 20 MG capsule   ALPRAZolam (XANAX) 0.5 MG tablet   Other Relevant Orders   CBC w/Diff/Platelet   TSH   Comprehensive metabolic panel   ADHD    Patient is currently holding Adderall as it seems to be exacerbating her anxiety Advised patient to continue doing this  she will discuss with psychiatry at her upcoming visit tomorrow      Family history of thyroid disease    Check TSH given family history of thyroid disease Thyroid is normal to palpation on exam today      Relevant Orders   TSH    Other Visit Diagnoses    Encounter  to establish care    -  Primary   Relevant Orders   CBC w/Diff/Platelet   Encounter for screening for HIV       Relevant Orders   HIV antibody (  with reflex)         we will request records from previous PCP in [place] as well as psychiatry  Addressed extensive list of chronic and acute medical problems today requiring extensive time in counseling and coordination of care.  Over half of this 45 minute visit were spent in counseling and coordinating care of multiple medical problems.   The entirety of the information documented in the History of Present Illness, Review of Systems and Physical Exam were personally obtained by me. Portions of this information were initially documented by Irving Burton Ratchford, CMA and reviewed by me for thoroughness and accuracy.     Shirlee Latch, MD  [facility]

## 2017-05-26 ENCOUNTER — Encounter: Payer: Self-pay | Admitting: *Deleted

## 2017-05-26 ENCOUNTER — Emergency Department
Admission: EM | Admit: 2017-05-26 | Discharge: 2017-05-26 | Disposition: A | Discharge status: Home / Self Care | Payer: Managed Care, Other (non HMO) | Attending: Emergency Medicine | Admitting: Emergency Medicine

## 2017-05-26 DIAGNOSIS — F410 Panic disorder [episodic paroxysmal anxiety] without agoraphobia: Secondary | ICD-10-CM | POA: Insufficient documentation

## 2017-05-26 DIAGNOSIS — Z5321 Procedure and treatment not carried out due to patient leaving prior to being seen by health care provider: Secondary | ICD-10-CM | POA: Insufficient documentation

## 2017-05-26 LAB — CBC WITH DIFFERENTIAL/PLATELET
Basophils Absolute: 60 cells/uL (ref 0–200)
Basophils Relative: 0.8 %
Eosinophils Absolute: 83 cells/uL (ref 15–500)
Eosinophils Relative: 1.1 %
HEMATOCRIT: 38.9 % (ref 35.0–45.0)
Hemoglobin: 13.2 g/dL (ref 11.7–15.5)
LYMPHS ABS: 2670 {cells}/uL (ref 850–3900)
MCH: 29.3 pg (ref 27.0–33.0)
MCHC: 33.9 g/dL (ref 32.0–36.0)
MCV: 86.4 fL (ref 80.0–100.0)
MPV: 10.8 fL (ref 7.5–12.5)
Monocytes Relative: 6.3 %
NEUTROS PCT: 56.2 %
Neutro Abs: 4215 cells/uL (ref 1500–7800)
Platelets: 284 10*3/uL (ref 140–400)
RBC: 4.5 10*6/uL (ref 3.80–5.10)
RDW: 13 % (ref 11.0–15.0)
Total Lymphocyte: 35.6 %
WBC: 7.5 10*3/uL (ref 3.8–10.8)
WBCMIX: 473 {cells}/uL (ref 200–950)

## 2017-05-26 LAB — COMPREHENSIVE METABOLIC PANEL
AG RATIO: 1.9 (calc) (ref 1.0–2.5)
ALBUMIN MSPROF: 4.6 g/dL (ref 3.6–5.1)
ALT: 9 U/L (ref 6–29)
AST: 12 U/L (ref 10–30)
Alkaline phosphatase (APISO): 47 U/L (ref 33–115)
BILIRUBIN TOTAL: 0.5 mg/dL (ref 0.2–1.2)
BUN: 10 mg/dL (ref 7–25)
CALCIUM: 9.6 mg/dL (ref 8.6–10.2)
CO2: 24 mmol/L (ref 20–32)
Chloride: 105 mmol/L (ref 98–110)
Creat: 0.88 mg/dL (ref 0.50–1.10)
GLOBULIN: 2.4 g/dL (ref 1.9–3.7)
Glucose, Bld: 83 mg/dL (ref 65–139)
POTASSIUM: 4.3 mmol/L (ref 3.5–5.3)
SODIUM: 138 mmol/L (ref 135–146)
Total Protein: 7 g/dL (ref 6.1–8.1)

## 2017-05-26 LAB — HIV ANTIBODY (ROUTINE TESTING W REFLEX): HIV 1&2 Ab, 4th Generation: NONREACTIVE

## 2017-05-26 LAB — TSH: TSH: 1.2 mIU/L

## 2017-05-26 NOTE — ED Triage Notes (Addendum)
Pt was awakened tonight with feeling anxious and unable to breathe.  Hx panic attacks/anxiety.  No chest pain.  No n/v/d. Skin warm and dry.  Pt alert.  Pt saw her doctor this morning with similar sx.  And pt has appt with psychiatrist in the morning.   Pt had labs done today at md's office.

## 2017-05-27 ENCOUNTER — Encounter: Payer: Self-pay | Admitting: Family Medicine

## 2017-05-27 ENCOUNTER — Ambulatory Visit (INDEPENDENT_AMBULATORY_CARE_PROVIDER_SITE_OTHER): Payer: 59 | Admitting: Family Medicine

## 2017-05-27 VITALS — BP 100/70 | HR 94 | Temp 98.3°F | Resp 16 | Wt 187.0 lb

## 2017-05-27 DIAGNOSIS — F329 Major depressive disorder, single episode, unspecified: Secondary | ICD-10-CM | POA: Diagnosis not present

## 2017-05-27 DIAGNOSIS — F410 Panic disorder [episodic paroxysmal anxiety] without agoraphobia: Secondary | ICD-10-CM

## 2017-05-27 DIAGNOSIS — F419 Anxiety disorder, unspecified: Secondary | ICD-10-CM | POA: Diagnosis not present

## 2017-05-27 DIAGNOSIS — K219 Gastro-esophageal reflux disease without esophagitis: Secondary | ICD-10-CM | POA: Diagnosis not present

## 2017-05-27 DIAGNOSIS — F32A Depression, unspecified: Secondary | ICD-10-CM

## 2017-05-27 MED ORDER — RANITIDINE HCL 150 MG PO TABS: 150.0000 mg | ORAL_TABLET | Freq: Two times a day (BID) | ORAL | 3 refills | Status: DC

## 2017-05-27 MED ORDER — CLONAZEPAM 1 MG PO TABS: 1.0000 mg | ORAL_TABLET | Freq: Two times a day (BID) | ORAL | 1 refills | Status: DC | PRN

## 2017-05-27 MED ORDER — FLUOXETINE HCL 40 MG PO CAPS: 40.0000 mg | ORAL_CAPSULE | Freq: Every day | ORAL | 3 refills | Status: DC

## 2017-05-27 NOTE — Progress Notes (Signed)
Patient: Nicole Wade Female    DOB: 1991/02/28   26 y.o.   MRN: [id number] Visit Date: 05/27/2017  Today's Provider: Shirlee Latch, MD   Chief Complaint  Patient presents with  . Anxiety   Subjective:    Anxiety  Presents for follow-up (Pt saw her psychologist at [facility] yesterday. Her pshychologist advised her to write down all of her physical sx, discuss them with PCP.) visit. Symptoms include chest pain (tightness), compulsions, confusion, decreased concentration, depressed mood, dizziness, dry mouth, excessive worry, feeling of choking, hyperventilation, insomnia (premature awakening), irritability, malaise, muscle tension, nausea, nervous/anxious behavior, obsessions, palpitations, panic, restlessness and shortness of breath (new sx started 2 nights ago; went to ED and states she was waiting for 2 hours without being seen, so she left. She was advised her vitals were WNL, and an EKG was performed.). Patient reports no suicidal ideas. Primary symptoms comment: pt also c/o migraine x 2 days. The severity of symptoms is severe, causing significant distress, interfering with daily activities and incapacitating.    Pt states Dr. Toya Smothers did not give her klonopin any refills, when she usually gives her 3 months worth of the medication. Last night was the first night that she did have Klonopin since she was 26 YO. States that she felt alright taking Xanax instead.  She brings a list of her physical symptoms that she would like "all the tests done for "to rule out anything bad. This includes can catch breath, shortness and tightness in chest, waking up in the middle of the night with chest pains and gasping for air, mold in home and at work, constant yawning, can eat and burping up everything, substance coming out of belly button, and uncontrollable shakes.  Increased belching and regurgitating food.  Taking probiotic. Feels like acid reflux. She is tried any other  medications for this. She has had symptoms like this previously. She says it is worse after eating pizza.  States that she has had her bellybutton pierced since she was about 12 and has never seen any drainage from it. There is no redness the area. There is no rash or itch. Yesterday she noticed a substance that looked like ear wax coming out of her bellybutton that has since resolved.      Allergies  Allergen Reactions  . Azithromycin Hives  . Penicillins Hives  . Sulfa Antibiotics Hives     Current Outpatient Prescriptions:  .  ALPRAZolam (XANAX) 0.5 MG tablet, Take 1 tablet by mouth 2 (two) times daily as needed., Disp: , Rfl: 0 .  FLUoxetine (PROZAC) 20 MG capsule, Take 20 mg by mouth every morning., Disp: , Rfl: 0 .  Probiotic Product (PROBIOTIC ADVANCED PO), Take by mouth., Disp: , Rfl:  .  amphetamine-dextroamphetamine (ADDERALL) 10 MG tablet, Take 10 mg by mouth 2 (two) times daily., Disp: , Rfl: 0 .  clonazePAM (KLONOPIN) 1 MG tablet, Take 1.5 tablets by mouth at bedtime., Disp: , Rfl: 0  Review of Systems  Constitutional: Positive for irritability.  Respiratory: Positive for shortness of breath (new sx started 2 nights ago; went to ED and states she was waiting for 2 hours without being seen, so she left. She was advised her vitals were WNL, and an EKG was performed.).   Cardiovascular: Positive for chest pain (tightness) and palpitations.  Gastrointestinal: Positive for nausea.  Neurological: Positive for dizziness.  Psychiatric/Behavioral: Positive for confusion and decreased concentration. Negative for suicidal ideas. The patient is nervous/anxious and  has insomnia (premature awakening).     Social History  Substance Use Topics  . Smoking status: Former Smoker    Packs/day: 0.25    Years: 11.00    Types: Cigarettes    Quit date: 05/25/2017  . Smokeless tobacco: Former Neurosurgeon    Types: Snuff    Quit date: 03/23/2016     Comment: started smoking at age 56  . Alcohol  use No   Objective:   BP 100/70 (BP Location: Left Arm, Patient Position: Sitting, Cuff Size: Large)   Pulse 94   Temp 98.3 F (36.8 C) (Oral)   Resp 16   Wt 187 lb (84.8 kg)   LMP 05/26/2017 (Exact Date)   BMI 35.33 kg/m  Vitals:   05/27/17 0810  BP: 100/70  Pulse: 94  Resp: 16  Temp: 98.3 F (36.8 C)  TempSrc: Oral  Weight: 187 lb (84.8 kg)     Physical Exam  Constitutional: She is oriented to person, place, and time. She appears well-developed and well-nourished. No distress.  HENT:  Head: Normocephalic and atraumatic.  Right Ear: External ear normal.  Left Ear: External ear normal.  Eyes: Conjunctivae are normal. No scleral icterus.  Cardiovascular: Normal rate, regular rhythm, normal heart sounds and intact distal pulses.   No murmur heard. Pulmonary/Chest: Effort normal and breath sounds normal. No respiratory distress. She has no wheezes. She has no rales.  Abdominal: Soft. Bowel sounds are normal. She exhibits no distension. There is no tenderness. There is no rebound and no guarding.  Musculoskeletal: She exhibits no edema or deformity.  Neurological: She is alert and oriented to person, place, and time.  Skin: Skin is warm and dry. No rash noted.  Psychiatric:  Very anxious. Intermittently tearful. Denies SI, AVH, HI.  Vitals reviewed.      Assessment & Plan:      Problem List Items Addressed This Visit      Digestive   Gastroesophageal reflux disease    Symptoms consistent with GERD Abdominal exam benign Trial of H2 blocker      Relevant Medications   ranitidine (ZANTAC) 150 MG tablet     Other   Panic disorder    Uncontrolled See plan for anxiety to improve medical management and decrease concomitant benzo use Advised her to continue to hold Adderall as this may exacerbate panic symptoms Reassured about physical symptoms, with recent TSH, CBC, CMP normal and EKG from ED last night when symptomatic is unchanged from previous      Relevant  Medications   FLUoxetine (PROZAC) 40 MG capsule   Depression    Uncontrolled Denies SI Has another follow-up with psychology tomorrow Referral to psychiatry Change and benzos as her anxiety assessment and plan Increase Prozac to 40 mg daily Follow-up in one month Repeat pH Q9 and GAD 7 at next visit      Relevant Medications   FLUoxetine (PROZAC) 40 MG capsule   Other Relevant Orders   Ambulatory referral to Psychiatry   Anxiety - Primary    Uncontrolled Patient realized that her visit yesterday was with a psychologist and psychiatrist and she is requesting referral to psychiatry, which was placed today Since she is not currently seeing anyone for medical management, I will make some changes today Discussed the risks of benzos, especially in combination and she is currently taking them We will increase her Prozac to 40 mg daily Discontinue Xanax Change Klonopin from 1.5 mg daily at bedtime to 1 mg twice a day when  necessary Discussed goal to continue weaning benzos Follow-up in one month      Relevant Medications   FLUoxetine (PROZAC) 40 MG capsule   Other Relevant Orders   Ambulatory referral to Psychiatry          The entirety of the information documented in the History of Present Illness, Review of Systems and Physical Exam were personally obtained by me. Portions of this information were initially documented by Irving Burton Ratchford, CMA and reviewed by me for thoroughness and accuracy.     Shirlee Latch, MD  [facility]

## 2017-05-27 NOTE — Assessment & Plan Note (Signed)
Uncontrolled See plan for anxiety to improve medical management and decrease concomitant benzo use Advised her to continue to hold Adderall as this may exacerbate panic symptoms Reassured about physical symptoms, with recent TSH, CBC, CMP normal and EKG from ED last night when symptomatic is unchanged from previous

## 2017-05-27 NOTE — Assessment & Plan Note (Signed)
Uncontrolled Denies SI Has another follow-up with psychology tomorrow Referral to psychiatry Change and benzos as her anxiety assessment and plan Increase Prozac to 40 mg daily Follow-up in one month Repeat pH Q9 and GAD 7 at next visit

## 2017-05-27 NOTE — Assessment & Plan Note (Signed)
Symptoms consistent with GERD Abdominal exam benign Trial of H2 blocker

## 2017-05-27 NOTE — Progress Notes (Signed)
Medical records received from [facility]  Review of records reveals the following: - Patient with long-standing history of anxiety depression with somatic symptoms including chest pain, palpitations, arm numbness, abdominal symptoms with normal chest x-rays and EKGs5  - 24-hour Holter monitor was performed in 2015 that showed maximum heart rate 147, mean heart rate 91, no positives, sinus rhythm throughout with occasional episodes of sinus bradycardia and sinus tachycardia. 1 episode of second-degree AV block noted  - Old EKG scanned for comparison sake  Bacigalupo, Marzella Schlein, MD, MPH [facility] 05/27/2017 11:16 AM

## 2017-05-27 NOTE — Assessment & Plan Note (Signed)
Uncontrolled Patient realized that her visit yesterday was with a psychologist and psychiatrist and she is requesting referral to psychiatry, which was placed today Since she is not currently seeing anyone for medical management, I will make some changes today Discussed the risks of benzos, especially in combination and she is currently taking them We will increase her Prozac to 40 mg daily Discontinue Xanax Change Klonopin from 1.5 mg daily at bedtime to 1 mg twice a day when necessary Discussed goal to continue weaning benzos Follow-up in one month

## 2017-05-27 NOTE — Patient Instructions (Signed)

## 2017-06-01 ENCOUNTER — Encounter: Payer: Self-pay | Admitting: Family Medicine

## 2017-06-03 ENCOUNTER — Encounter: Payer: Self-pay | Admitting: Family Medicine

## 2017-06-08 ENCOUNTER — Encounter: Payer: Self-pay | Admitting: Family Medicine

## 2017-06-10 ENCOUNTER — Telehealth: Payer: Self-pay | Admitting: Family Medicine

## 2017-06-10 NOTE — Telephone Encounter (Signed)
ROI ([facility]) faxed to [facility] for records.

## 2017-06-10 NOTE — Telephone Encounter (Signed)
ROI ([facility]) faxed to Dr. Val RilesAxonovitz for records.

## 2017-06-10 NOTE — Telephone Encounter (Signed)
ROI ([facility]) faxed to [facility] Corky [facility]so[facility] for records.

## 2017-06-10 NOTE — Telephone Encounter (Signed)
ROI ([facility]) faxed to [facility] for records.

## 2017-06-14 ENCOUNTER — Telehealth: Payer: Self-pay | Admitting: Family Medicine

## 2017-06-14 NOTE — Telephone Encounter (Signed)
Pt states the OBGYN and Psyc MD states they think she has been mis dxed.  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  Pt states this is information for the provider.  She states she would like to know what Dr. Senaida LangeB's input is on the situation.  She has an appt with OBGYN tomorrow and will discuss with them tomorrow as well.

## 2017-06-14 NOTE — Telephone Encounter (Signed)
I'm not sure what those acronyms mean.  The patient means PMDD or perimenopausal dysphoric disorder. Not sure what she could mean by PSE as this is an acronym used to describe confusion due to liver disease. I have not seen any notes from her psychiatrist. Even if she does have PMDD, this is still treated with an SSRI as we are doing.  Her psychiatric medications should either be managed by PCP or psych to avoid "too many cooks in the kitchen." Therapy is beneficial in addition to medications.  Erasmo DownerBacigalupo, Bera Pinela M, MD, MPH [facility] 06/14/2017 3:02 PM

## 2017-06-14 NOTE — Telephone Encounter (Signed)
Please review. Thanks!  

## 2017-06-17 NOTE — Telephone Encounter (Signed)
Patient advised.

## 2017-06-28 ENCOUNTER — Ambulatory Visit: Payer: Self-pay | Admitting: Family Medicine

## 2017-07-06 ENCOUNTER — Ambulatory Visit: Payer: Self-pay | Admitting: Family Medicine

## 2017-07-07 ENCOUNTER — Ambulatory Visit: Payer: 59 | Admitting: Family Medicine

## 2017-07-07 ENCOUNTER — Encounter: Payer: Self-pay | Admitting: Family Medicine

## 2017-07-07 VITALS — BP 102/74 | HR 80 | Temp 98.2°F | Resp 16 | Wt 196.0 lb

## 2017-07-07 DIAGNOSIS — F419 Anxiety disorder, unspecified: Secondary | ICD-10-CM | POA: Diagnosis not present

## 2017-07-07 DIAGNOSIS — F32A Depression, unspecified: Secondary | ICD-10-CM

## 2017-07-07 DIAGNOSIS — F410 Panic disorder [episodic paroxysmal anxiety] without agoraphobia: Secondary | ICD-10-CM

## 2017-07-07 DIAGNOSIS — F909 Attention-deficit hyperactivity disorder, unspecified type: Secondary | ICD-10-CM

## 2017-07-07 DIAGNOSIS — F329 Major depressive disorder, single episode, unspecified: Secondary | ICD-10-CM | POA: Diagnosis not present

## 2017-07-07 MED ORDER — FLUOXETINE HCL 20 MG PO CAPS: 60.0000 mg | ORAL_CAPSULE | Freq: Every day | ORAL | 2 refills | Status: DC

## 2017-07-07 MED ORDER — CLONAZEPAM 1 MG PO TABS: 0.5000 mg | ORAL_TABLET | Freq: Two times a day (BID) | ORAL | 1 refills | Status: DC | PRN

## 2017-07-07 NOTE — Assessment & Plan Note (Signed)
Panic symptoms resolved since increasing prozac dose Will increase to max dose to improve anxiety further and plan to continue benzo taper Advised therapy

## 2017-07-07 NOTE — Assessment & Plan Note (Signed)
Continue holding adderall as it was exacerbating anxiety Referral to pscyh again for medicaiton management in setting of treatment for both anxiety and ADHD

## 2017-07-07 NOTE — Progress Notes (Signed)
Patient: Nicole Wade Female    DOB: 1990/12/10   26 y.o.   MRN: [id number] Visit Date: 07/07/2017  Today's Provider: Shirlee Latch, MD   Chief Complaint  Patient presents with  . Depression  . Anxiety   Subjective:    Depression         This is a chronic (FU from 05/27/2017; pt was referred to psychiatry and increased Fluoxetine to 40 mg at LOV) problem.  The problem has been gradually improving since onset.  Associated symptoms include decreased concentration (pt would like to discuss restarting Adderall), fatigue, irritable (with menses), restlessness (legs), decreased interest, body aches (back), indigestion (pharmacy never filled Zantac) and sad (improved).  Associated symptoms include no helplessness, no hopelessness, does not have insomnia, no appetite change, no headaches (improving) and no suicidal ideas.  Compliance with treatment is good (pt is taking Prozac as prescribed, but states her psychiatrist was not improving sx, but she is seeing a "spiritual healer", which is helping sx).  Past medical history includes anxiety.   Anxiety  Presents for follow-up (LOV 05/27/2017; Xanax was D/C at LOV, and Klonopin was changed from 1.5 mg qhs to 1 mg BID PRN) visit. Symptoms include decreased concentration (pt would like to discuss restarting Adderall) and restlessness (legs). Patient reports no insomnia, panic (pt states she has not had any panic attacks) or suicidal ideas.   Compliance with medications: good compliance.  Pt states this is improving since LOV.  She is tolerating Prozac well without side effects.   Depression screen [facility] 2/9 07/07/2017 05/25/2017  Decreased Interest 1 3  Down, Depressed, Hopeless 2 3  PHQ - 2 Score 3 6  Altered sleeping 1 3  Tired, decreased energy 3 3  Change in appetite 3 3  Feeling bad or failure about yourself  2 3  Trouble concentrating 2 3  Moving slowly or fidgety/restless 2 2  Suicidal thoughts 0 0  PHQ-9 Score 16 23    Difficult doing work/chores Extremely dIfficult Extremely dIfficult    GAD 7 : Generalized Anxiety Score 07/07/2017 05/25/2017  Nervous, Anxious, on Edge 2 3  Control/stop worrying 3 3  Worry too much - different things 3 3  Trouble relaxing 3 3  Restless 3 3  Easily annoyed or irritable 2 3  Afraid - awful might happen 1 3  Total GAD 7 Score 17 21  Anxiety Difficulty Somewhat difficult Somewhat difficult        Allergies  Allergen Reactions  . Azithromycin Hives  . Penicillins Hives  . Sulfa Antibiotics Hives     Current Outpatient Medications:  .  clonazePAM (KLONOPIN) 1 MG tablet, Take 0.5-1 tablets (0.5-1 mg total) 2 (two) times daily as needed by mouth for anxiety., Disp: 45 tablet, Rfl: 1 .  MILI 0.25-35 MG-MCG tablet, , Disp: , Rfl: 0 .  Probiotic Product (PROBIOTIC ADVANCED PO), Take by mouth., Disp: , Rfl:  .  amphetamine-dextroamphetamine (ADDERALL) 10 MG tablet, Take 10 mg by mouth 2 (two) times daily., Disp: , Rfl: 0 .  FLUoxetine (PROZAC) 20 MG capsule, Take 3 capsules (60 mg total) daily by mouth., Disp: 90 capsule, Rfl: 2 .  ranitidine (ZANTAC) 150 MG tablet, Take 1 tablet (150 mg total) by mouth 2 (two) times daily. (Patient not taking: Reported on 07/07/2017), Disp: 60 tablet, Rfl: 3  Review of Systems  Constitutional: Positive for fatigue. Negative for appetite change.  Neurological: Negative for headaches (improving).  Psychiatric/Behavioral: Positive for decreased  concentration (pt would like to discuss restarting Adderall) and depression. Negative for suicidal ideas. The patient does not have insomnia.     Social History   Tobacco Use  . Smoking status: Former Smoker    Packs/day: 0.25    Years: 11.00    Pack years: 2.75    Types: Cigarettes    Last attempt to quit: 05/25/2017    Years since quitting: 0.1  . Smokeless tobacco: Former NeurosurgeonUser    Types: Snuff    Quit date: 03/23/2016  . Tobacco comment: started smoking at age 26  Substance Use  Topics  . Alcohol use: No   Objective:   BP 102/74 (BP Location: Left Arm, Patient Position: Sitting, Cuff Size: Large)   Pulse 80   Temp 98.2 F (36.8 C) (Oral)   Resp 16   Wt 196 lb (88.9 kg)   LMP 07/01/2017   BMI 37.03 kg/m  Vitals:   07/07/17 0809  BP: 102/74  Pulse: 80  Resp: 16  Temp: 98.2 F (36.8 C)  TempSrc: Oral  Weight: 196 lb (88.9 kg)     Physical Exam  Constitutional: She is oriented to person, place, and time. She appears well-developed and well-nourished. She is irritable (with menses). No distress.  HENT:  Head: Normocephalic and atraumatic.  Eyes: Conjunctivae are normal. No scleral icterus.  Neck: Neck supple.  Cardiovascular: Normal rate, regular rhythm and normal heart sounds.  No murmur heard. Pulmonary/Chest: Effort normal and breath sounds normal. No respiratory distress. She has no wheezes. She has no rales.  Musculoskeletal: She exhibits no edema.  Neurological: She is alert and oriented to person, place, and time.  Skin: Skin is warm and dry. No rash noted.  Psychiatric: Her behavior is normal. Thought content normal.  Anxious mood, normal affect  Vitals reviewed.      Assessment & Plan:      Problem List Items Addressed This Visit      Other   Panic disorder    Panic symptoms resolved since increasing prozac dose Will increase to max dose to improve anxiety further and plan to continue benzo taper Advised therapy      Relevant Medications   FLUoxetine (PROZAC) 20 MG capsule   Other Relevant Orders   Ambulatory referral to Psychiatry   Depression    Control improving with higher prozac dose Increase to 60mg  daily Advised therapy Referral to psychiatry placed again - [place] moving there soon Decrease klonopin dose to continue taper F/u in 2 months Repeat phq9 and gad7 at f/u      Relevant Medications   FLUoxetine (PROZAC) 20 MG capsule   Other Relevant Orders   Ambulatory referral to  Psychiatry   Anxiety - Primary    Control improving Panic attacks resolved Referral to psychiatry for assistance with concomitant treatment of ADHD and anxiety Recommend therapy Increase prozac to 60mg  daily Decrease klonopin to 0.5mg -1mg  BID prn #45 Discussed only using prn, not scheuled Will continue benzo taper at f/u and plan to only give 30 pills monthly F/u in 2 months      Relevant Medications   FLUoxetine (PROZAC) 20 MG capsule   Other Relevant Orders   Ambulatory referral to Psychiatry   ADHD    Continue holding adderall as it was exacerbating anxiety Referral to pscyh again for medicaiton management in setting of treatment for both anxiety and ADHD      Relevant Orders   Ambulatory referral to Psychiatry  Return in about 2 months (around 09/06/2017).     The entirety of the information documented in the History of Present Illness, Review of Systems and Physical Exam were personally obtained by me. Portions of this information were initially documented by Irving BurtonEmily Ratchford, CMA and reviewed by me for thoroughness and accuracy.     Shirlee LatchAngela Jazion Atteberry, MD  [facility]

## 2017-07-07 NOTE — Assessment & Plan Note (Signed)
Control improving with higher prozac dose Increase to 60mg  daily Advised therapy Referral to psychiatry placed again [place] there [place] Decrease klonopin dose to continue taper F/u in 2 months Repeat phq9 and gad7 at f/u

## 2017-07-07 NOTE — Assessment & Plan Note (Signed)
Control improving Panic attacks resolved Referral to psychiatry for assistance with concomitant treatment of ADHD and anxiety Recommend therapy Increase prozac to 60mg  daily Decrease klonopin to 0.5mg -1mg  BID prn #45 Discussed only using prn, not scheuled Will continue benzo taper at f/u and plan to only give 30 pills monthly F/u in 2 months

## 2017-07-08 ENCOUNTER — Other Ambulatory Visit: Payer: Self-pay | Admitting: Family Medicine

## 2017-07-08 ENCOUNTER — Telehealth: Payer: Self-pay | Admitting: Family Medicine

## 2017-07-08 DIAGNOSIS — F419 Anxiety disorder, unspecified: Secondary | ICD-10-CM

## 2017-07-08 DIAGNOSIS — F429 Obsessive-compulsive disorder, unspecified: Secondary | ICD-10-CM

## 2017-07-08 DIAGNOSIS — F329 Major depressive disorder, single episode, unspecified: Secondary | ICD-10-CM

## 2017-07-08 DIAGNOSIS — F32A Depression, unspecified: Secondary | ICD-10-CM

## 2017-07-08 DIAGNOSIS — F909 Attention-deficit hyperactivity disorder, unspecified type: Secondary | ICD-10-CM

## 2017-07-08 DIAGNOSIS — F410 Panic disorder [episodic paroxysmal anxiety] without agoraphobia: Secondary | ICD-10-CM

## 2017-07-08 NOTE — Telephone Encounter (Signed)
Please review

## 2017-07-08 NOTE — Telephone Encounter (Signed)
To start with, regular exercise (30 minutes at least 5 days weekly) and decreasing snacking, sugary drinks, sweets, and eating after dinner will help.  Erasmo DownerBacigalupo, Ali Mclaurin M, MD, MPH [facility] 07/08/2017 4:16 PM

## 2017-07-08 NOTE — Telephone Encounter (Signed)
Pt states she was in yesterday and started discussing weight loss with Dr B but did not get a plan.  Pt is asking if Dr B can give her a plan to help with weight loss. CB# [phone]/[phone]/MW

## 2017-07-09 NOTE — Telephone Encounter (Signed)
Patient is already doing this as mentioned below, she is doing Keto diet also but it is not working, she is at a stand still point. She said she said all of this yesterday but this was not in the original message. I apologized to the Smith Internationalpatient-Lillia Lengel V Elicia Lui, RMA

## 2017-07-09 NOTE — Telephone Encounter (Signed)
Pt advised as below-Anastasiya V Hopkins, RMA  

## 2017-07-09 NOTE — Telephone Encounter (Signed)
Then we can discuss at next visit.  She could look into intermittent fasting to see if this diet might help.  Erasmo DownerBacigalupo, Prinston Kynard M, MD, MPH [facility] 07/09/2017 9:43 AM

## 2017-09-06 ENCOUNTER — Ambulatory Visit: Payer: Self-pay | Admitting: Family Medicine

## 2017-09-09 ENCOUNTER — Encounter: Payer: Self-pay | Admitting: Family Medicine

## 2017-09-09 ENCOUNTER — Ambulatory Visit: Payer: BLUE CROSS/BLUE SHIELD | Admitting: Family Medicine

## 2017-09-09 VITALS — BP 112/82 | HR 76 | Temp 98.8°F | Resp 16 | Wt 212.0 lb

## 2017-09-09 DIAGNOSIS — E669 Obesity, unspecified: Secondary | ICD-10-CM | POA: Insufficient documentation

## 2017-09-09 DIAGNOSIS — F329 Major depressive disorder, single episode, unspecified: Secondary | ICD-10-CM | POA: Diagnosis not present

## 2017-09-09 DIAGNOSIS — F909 Attention-deficit hyperactivity disorder, unspecified type: Secondary | ICD-10-CM | POA: Diagnosis not present

## 2017-09-09 DIAGNOSIS — K219 Gastro-esophageal reflux disease without esophagitis: Secondary | ICD-10-CM | POA: Diagnosis not present

## 2017-09-09 DIAGNOSIS — F419 Anxiety disorder, unspecified: Secondary | ICD-10-CM | POA: Diagnosis not present

## 2017-09-09 DIAGNOSIS — Z6841 Body Mass Index (BMI) 40.0 and over, adult: Secondary | ICD-10-CM

## 2017-09-09 DIAGNOSIS — F32A Depression, unspecified: Secondary | ICD-10-CM

## 2017-09-09 MED ORDER — CLONAZEPAM 1 MG PO TABS: 0.5000 mg | ORAL_TABLET | Freq: Two times a day (BID) | ORAL | 2 refills | Status: AC | PRN

## 2017-09-09 MED ORDER — FLUOXETINE HCL 20 MG PO CAPS: 60.0000 mg | ORAL_CAPSULE | Freq: Every day | ORAL | 2 refills | Status: DC

## 2017-09-09 MED ORDER — RANITIDINE HCL 150 MG PO TABS: 150.0000 mg | ORAL_TABLET | Freq: Two times a day (BID) | ORAL | 3 refills | Status: DC

## 2017-09-09 NOTE — Patient Instructions (Addendum)
[facility] in [place] (Phone [phone])   Diet Recommendations for Diabetes  And preventing diabetes  Starchy (carb) foods include: Bread, rice, pasta, potatoes, corn, crackers, bagels, muffins, all baked goods.  (Fruits, milk, and yogurt also have carbohydrate, but most of these foods will not spike your blood sugar as the starchy foods will.)  A few fruits do cause high blood sugars; use small portions of bananas (limit to 1/2 at a time), grapes, watermelon, and most tropical fruits.    Protein foods include: Meat, fish, poultry, eggs, dairy foods, and beans such as pinto and kidney beans (beans also provide carbohydrate).   1. Eat at least 3 meals and 1-2 snacks per day. Never go more than 4-5 hours while awake without eating. Eat breakfast within the first hour of getting up.   2. Limit starchy foods to TWO per meal and ONE per snack. ONE portion of a starchy  food is equal to the following:   - ONE slice of bread (or its equivalent, such as half of a hamburger bun).   - 1/2 cup of a "scoopable" starchy food such as potatoes or rice.   - 15 grams of carbohydrate as shown on food label.  3. Include at every meal: a protein food, a carb food, and vegetables and/or fruit.   - Obtain twice as many veg's as protein or carbohydrate foods for both lunch and dinner.   - Fresh or frozen veg's are best.   - Try to keep frozen veg's on hand for a quick vegetable serving.

## 2017-09-09 NOTE — Progress Notes (Signed)
Patient: Nicole Wade Female    DOB: 27-Jul-1991   27 y.o.   MRN: [id number] Visit Date: 09/09/2017  Today's Provider: Shirlee Latch, MD   I, Joslyn Hy, CMA, am acting as scribe for Shirlee Latch, MD.  Chief Complaint  Patient presents with  . Anxiety   Subjective:    Anxiety  Presents for follow-up (Last seen 2 months ago. Prozac was increased to 60 mg, and Klonopin was decreased to 0.5 mg-1mg  BID PRN. #45 tabs were prescribed, and PCP wants to decrease this to #30 at today's FU) visit. Symptoms include chest pain, decreased concentration, depressed mood, dizziness (after stanfing too fast, or too relaxed), dry mouth, excessive worry ("feeling lost"), feeling of choking (and sore throat), insomnia, irritability, malaise, muscle tension, nervous/anxious behavior, obsessions ("stress cleaning"), palpitations, panic (worsening since LOV), restlessness and shortness of breath. Patient reports no compulsions, confusion, hyperventilation, nausea or suicidal ideas. The quality of sleep is poor ("up numerous times in the night, tossing and turning." Has tried aromatherapy, without relief. Working on sleep hygeine.).   Compliance with medications: pharmacy only filled prozac 40 mg tabs once daily. She states she was not contacted about psych referral. She does feel better with meditation, spiritual healing.  Pt recently tried different birth control, and is not taking placebos. Her GYN, Dr. Toya Smothers believed this would improve anxiety. Mood swings are improved per pt, but anxiety and panic are worsening. She believes this change in OCP's has caused weight gain, acne.  She states she feels very fatigued, no energy and is unable to concentrate since she has D/C Adderall.  Depression screen [facility] 2/9 09/09/2017 07/07/2017 05/25/2017  Decreased Interest 3 1 3   Down, Depressed, Hopeless 3 2 3   PHQ - 2 Score 6 3 6   Altered sleeping 3 1 3   Tired, decreased energy 3 3 3     Change in appetite 2 3 3   Feeling bad or failure about yourself  3 2 3   Trouble concentrating 3 2 3   Moving slowly or fidgety/restless 1 2 2   Suicidal thoughts 0 0 0  PHQ-9 Score 21 16 23   Difficult doing work/chores Very difficult Extremely dIfficult Extremely dIfficult    GAD 7 : Generalized Anxiety Score 09/09/2017 07/07/2017 05/25/2017  Nervous, Anxious, on Edge 3 2 3   Control/stop worrying 3 3 3   Worry too much - different things 3 3 3   Trouble relaxing 3 3 3   Restless 2 3 3   Easily annoyed or irritable 3 2 3   Afraid - awful might happen 3 1 3   Total GAD 7 Score 20 17 21   Anxiety Difficulty Very difficult Somewhat difficult Somewhat difficult    Pt is concerned about weight gain. She states she is doing intermittent fasting, and only eating "organic foods", including kale, salads, tuna, only drinking water. She goes to the gym every other night. She does "a lot of cardio" at the gym, including running, stair stepper, and cycling. She has a strong family H/O diabetes, and is worried her weight gain will cause her to become diabetic.  She is concerned that being off of Adderall is causing weight gain.  Ate a lot of food over the holidays because she felt good about weight loss prior to that time.  Wt Readings from Last 3 Encounters:  09/09/17 212 lb (96.2 kg)  07/07/17 196 lb (88.9 kg)  05/27/17 187 lb (84.8 kg)       Allergies  Allergen Reactions  . Azithromycin  Hives  . Penicillins Hives  . Sulfa Antibiotics Hives     Current Outpatient Medications:  .  clonazePAM (KLONOPIN) 1 MG tablet, Take 0.5-1 tablets (0.5-1 mg total) by mouth 2 (two) times daily as needed for anxiety., Disp: 45 tablet, Rfl: 2 .  FLUoxetine (PROZAC) 20 MG capsule, Take 3 capsules (60 mg total) by mouth daily., Disp: 90 capsule, Rfl: 2 .  MILI 0.25-35 MG-MCG tablet, , Disp: , Rfl: 0 .  amphetamine-dextroamphetamine (ADDERALL) 10 MG tablet, Take 10 mg by mouth 2 (two) times daily., Disp: , Rfl: 0 .   ranitidine (ZANTAC) 150 MG tablet, Take 1 tablet (150 mg total) by mouth 2 (two) times daily., Disp: 60 tablet, Rfl: 3  Review of Systems  Constitutional: Positive for irritability.  Respiratory: Positive for shortness of breath.   Cardiovascular: Positive for chest pain and palpitations.  Gastrointestinal: Negative for nausea.  Neurological: Positive for dizziness (after stanfing too fast, or too relaxed).  Psychiatric/Behavioral: Positive for decreased concentration. Negative for confusion and suicidal ideas. The patient is nervous/anxious and has insomnia.     Social History   Tobacco Use  . Smoking status: Former Smoker    Packs/day: 0.25    Years: 11.00    Pack years: 2.75    Types: Cigarettes    Last attempt to quit: 05/25/2017    Years since quitting: 0.2  . Smokeless tobacco: Former NeurosurgeonUser    Types: Snuff    Quit date: 03/23/2016  . Tobacco comment: started smoking at age 27  Substance Use Topics  . Alcohol use: No   Objective:   BP 112/82 (BP Location: Left Arm, Patient Position: Sitting, Cuff Size: Large)   Pulse 76   Temp 98.8 F (37.1 C) (Oral)   Resp 16   Wt 212 lb (96.2 kg)   LMP 07/26/2017   SpO2 99%   BMI 40.06 kg/m  Vitals:   09/09/17 0839  BP: 112/82  Pulse: 76  Resp: 16  Temp: 98.8 F (37.1 C)  TempSrc: Oral  SpO2: 99%  Weight: 212 lb (96.2 kg)     Physical Exam  Constitutional: She is oriented to person, place, and time. She appears well-developed and well-nourished.  HENT:  Head: Normocephalic and atraumatic.  Mouth/Throat: Oropharynx is clear and moist.  Eyes: Conjunctivae are normal. No scleral icterus.  Neck: Neck supple.  Cardiovascular: Normal rate, regular rhythm, normal heart sounds and intact distal pulses.  No murmur heard. Pulmonary/Chest: Effort normal and breath sounds normal. No respiratory distress. She has no wheezes. She has no rales.  Abdominal: Soft. She exhibits no distension. There is no tenderness.  Musculoskeletal:  She exhibits no edema or deformity.  Lymphadenopathy:    She has no cervical adenopathy.  Neurological: She is alert and oriented to person, place, and time.  Skin: Skin is warm and dry. No rash noted.  Psychiatric:  Anxious, depressed mood, no SI/HI, no AVH Admits to obsessive thoughts Appropriate groom and dress  Vitals reviewed.       Assessment & Plan:      Problem List Items Addressed This Visit      Digestive   Gastroesophageal reflux disease    Previously discussed GERD Continues to have symptoms, but has not started treatment Will resend Rx for Zantac      Relevant Medications   ranitidine (ZANTAC) 150 MG tablet     Other   Depression    Previously increased Prozac to 60mg  daily, but patient was not given this  RX when she went to pharmacy - will resend Rx Very uncontrolled  Patient again given information about psych referral and plans to call and schedule appt Continue current klonopin dose at this time Plan for psych to take over management for all psych conditions F/u in 3 months      Relevant Medications   FLUoxetine (PROZAC) 20 MG capsule   Anxiety - Primary    Previously increased Prozac to 60mg  daily, but patient was not given this RX when she went to pharmacy - will resend Rx Very uncontrolled  Patient again given information about psych referral and plans to call and schedule appt Continue current klonopin dose at this time Plan for psych to take over management for all psych conditions F/u in 3 months      Relevant Medications   FLUoxetine (PROZAC) 20 MG capsule   ADHD    Continue to hold adderall as it seems counterproductive to give a benzo with a stimulant in the setting of uncontrolled anxiety Again gave patient information for psych referral Plan for them to manage this and other psych conditions      Obesity    Long discussion of diet and exercise Suspect uncontrolled depression/anxiety and change in OCPs may be  contributing Reassured patient that she has had normal fasting BGs and does not have diabetes. Advised her to talk to GYN if she believes OCP is causing weight gain          Return in about 3 months (around 12/08/2017).      The entirety of the information documented in the History of Present Illness, Review of Systems and Physical Exam were personally obtained by me. Portions of this information were initially documented by Irving Burton Ratchford, CMA and reviewed by me for thoroughness and accuracy.    Erasmo Downer, MD, MPH [facility] 09/09/2017 10:35 AM

## 2017-09-09 NOTE — Assessment & Plan Note (Signed)
Previously increased Prozac to 60mg  daily, but patient was not given this RX when she went to pharmacy - will resend Rx Very uncontrolled  Patient again given information about psych referral and plans to call and schedule appt Continue current klonopin dose at this time Plan for psych to take over management for all psych conditions F/u in 3 months

## 2017-09-09 NOTE — Assessment & Plan Note (Signed)
Long discussion of diet and exercise Suspect uncontrolled depression/anxiety and change in OCPs may be contributing Reassured patient that she has had normal fasting BGs and does not have diabetes. Advised her to talk to GYN if she believes OCP is causing weight gain

## 2017-09-09 NOTE — Assessment & Plan Note (Signed)
Previously increased Prozac to 60mg daily, but patient was not given this RX when she went to pharmacy - will resend Rx Very uncontrolled  Patient again given information about psych referral and plans to call and schedule appt Continue current klonopin dose at this time Plan for psych to take over management for all psych conditions F/u in 3 months 

## 2017-09-09 NOTE — Assessment & Plan Note (Signed)
Continue to hold adderall as it seems counterproductive to give a benzo with a stimulant in the setting of uncontrolled anxiety Again gave patient information for psych referral Plan for them to manage this and other psych conditions

## 2017-09-09 NOTE — Assessment & Plan Note (Signed)
Previously discussed GERD Continues to have symptoms, but has not started treatment Will resend Rx for Zantac

## 2017-10-15 ENCOUNTER — Encounter: Payer: Self-pay | Admitting: Family Medicine

## 2017-10-15 ENCOUNTER — Ambulatory Visit: Payer: BLUE CROSS/BLUE SHIELD | Admitting: Family Medicine

## 2017-10-15 VITALS — BP 104/64 | HR 80 | Resp 16 | Wt 215.0 lb

## 2017-10-15 DIAGNOSIS — E162 Hypoglycemia, unspecified: Secondary | ICD-10-CM

## 2017-10-15 DIAGNOSIS — Z8349 Family history of other endocrine, nutritional and metabolic diseases: Secondary | ICD-10-CM

## 2017-10-15 NOTE — Patient Instructions (Signed)
Hypoglycemia  Hypoglycemia occurs when the level of sugar (glucose) in the blood is too low. Glucose is a type of sugar that provides the body's main source of energy. Certain hormones (insulin and glucagon) control the level of glucose in the blood. Insulin lowers blood glucose, and glucagon increases blood glucose. Hypoglycemia can result from having too much insulin in the bloodstream, or from not eating enough food that contains glucose.  Hypoglycemia can happen in people who do or do not have diabetes. It can develop quickly, and it can be a medical emergency.  What are the causes?  Hypoglycemia occurs most often in people who have diabetes. If you have diabetes, hypoglycemia may be caused by:   Diabetes medicine.   Not eating enough, or not eating often enough.   Increased physical activity.   Drinking alcohol, especially when you have not eaten recently.    If you do not have diabetes, hypoglycemia may be caused by:   A tumor in the pancreas. The pancreas is the organ that makes insulin.   Not eating enough, or not eating for long periods at a time (fasting).   Severe infection or illness that affects the liver, heart, or kidneys.   Certain medicines.    You may also have reactive hypoglycemia. This condition causes hypoglycemia within 4 hours of eating a meal. This may occur after having stomach surgery. Sometimes, the cause of reactive hypoglycemia is not known.  What increases the risk?  Hypoglycemia is more likely to develop in:   People who have diabetes and take medicines to lower blood glucose.   People who abuse alcohol.   People who have a severe illness.    What are the signs or symptoms?  Hypoglycemia may not cause any symptoms. If you have symptoms, they may include:   Hunger.   Anxiety.   Sweating and feeling clammy.   Confusion.   Dizziness or feeling light-headed.   Sleepiness.   Nausea.   Increased heart rate.   Headache.   Blurry  vision.   Seizure.   Nightmares.   Tingling or numbness around the mouth, lips, or tongue.   A change in speech.   Decreased ability to concentrate.   A change in coordination.   Restless sleep.   Tremors or shakes.   Fainting.   Irritability.    How is this diagnosed?  Hypoglycemia is diagnosed with a blood test to measure your blood glucose level. This blood test is done while you are having symptoms. Your health care provider may also do a physical exam and review your medical history.  If you do not have diabetes, other tests may be done to find the cause of your hypoglycemia.  How is this treated?  This condition can often be treated by immediately eating or drinking something that contains glucose, such as:   3-4 sugar tablets (glucose pills).   Glucose gel, 15-gram tube.   Fruit juice, 4 oz (120 mL).   Regular soda (not diet soda), 4 oz (120 mL).   Low-fat milk, 4 oz (120 mL).   Several pieces of hard candy.   Sugar or honey, 1 Tbsp.    Treating Hypoglycemia If You Have Diabetes    If you are alert and able to swallow safely, follow the 15:15 rule:   Take 15 grams of a rapid-acting carbohydrate. Rapid-acting options include:  ? 1 tube of glucose gel.  ? 3 glucose pills.  ? 6-8 pieces of hard candy.  ?   4 oz (120 mL) of fruit juice.  ? 4 oz (120 ml) of regular (not diet) soda.   Check your blood glucose 15 minutes after you take the carbohydrate.   If the repeat blood glucose level is still at or below 70 mg/dL (3.9 mmol/L), take 15 grams of a carbohydrate again.   If your blood glucose level does not increase above 70 mg/dL (3.9 mmol/L) after 3 tries, seek emergency medical care.   After your blood glucose level returns to normal, eat a meal or a snack within 1 hour.    Treating Severe Hypoglycemia  Severe hypoglycemia is when your blood glucose level is at or below 54 mg/dL (3 mmol/L). Severe hypoglycemia is an emergency. Do not wait to see if the symptoms will go away. Get medical help  right away. Call your local emergency services (911 in the [place] Do not drive yourself to the hospital.  If you have severe hypoglycemia and you cannot eat or drink, you may need an injection of glucagon. A family member or close friend should learn how to check your blood glucose and how to give you a glucagon injection. Ask your health care provider if you need to have an emergency glucagon injection kit available.  Severe hypoglycemia may need to be treated in a hospital. The treatment may include getting glucose through an IV tube. You may also need treatment for the cause of your hypoglycemia.  Follow these instructions at home:  General instructions   Avoid any diets that cause you to not eat enough food. Talk with your health care provider before you start any new diet.   Take over-the-counter and prescription medicines only as told by your health care provider.   Limit alcohol intake to no more than 1 drink per day for nonpregnant women and 2 drinks per day for men. One drink equals 12 oz of beer, 5 oz of wine, or 1 oz of hard liquor.   Keep all follow-up visits as told by your health care provider. This is important.  If You Have Diabetes:     Make sure you know the symptoms of hypoglycemia.   Always have a rapid-acting carbohydrate snack with you to treat low blood sugar.   Follow your diabetes management plan, as told by your health care provider. Make sure you:  ? Take your medicines as directed.  ? Follow your exercise plan.  ? Follow your meal plan. Eat on time, and do not skip meals.  ? Check your blood glucose as often as directed. Make sure to check your blood glucose before and after exercise. If you exercise longer or in a different way than usual, check your blood glucose more often.  ? Follow your sick day plan whenever you cannot eat or drink normally. Make this plan in advance with your health care provider.   Share your diabetes management plan with people in your workplace, school,  and household.   Check your urine for ketones when you are ill and as told by your health care provider.   Carry a medical alert card or wear medical alert jewelry.  If You Have Reactive Hypoglycemia or Low Blood Sugar From Other Causes:   Monitor your blood glucose as told by your health care provider.   Follow instructions from your health care provider about eating or drinking restrictions.  Contact a health care provider if:   You have problems keeping your blood glucose in your target range.   You have   frequent episodes of hypoglycemia.  Get help right away if:   You continue to have hypoglycemia symptoms after eating or drinking something containing glucose.   Your blood glucose is at or below 54 mg/dL (3 mmol/L).   You have a seizure.   You faint.  These symptoms may represent a serious problem that is an emergency. Do not wait to see if the symptoms will go away. Get medical help right away. Call your local emergency services (911 in the [place] Do not drive yourself to the hospital.  This information is not intended to replace advice given to you by your health care provider. Make sure you discuss any questions you have with your health care provider.  Document Released: 08/10/2005 Document Revised: 01/22/2016 Document Reviewed: 09/13/2015  Elsevier Interactive Patient Education  2018 Elsevier Inc[employer]

## 2017-10-15 NOTE — Assessment & Plan Note (Signed)
One episode of hypoglycemia after high carb meal Suspect this was reaction to such high carb intake Will screen for diabets with A1c Advised moderate carb intake

## 2017-10-15 NOTE — Assessment & Plan Note (Signed)
Will recheck TSH and free T4, as she is scared Doubt this is related to hypoglycemia

## 2017-10-15 NOTE — Progress Notes (Signed)
Patient: Nicole Wade Female    DOB: [birth year]   27 y.o.   MRN: [id number] Visit Date: 10/15/2017  Today's Provider: Shirlee Latch, MD   I, Joslyn Hy, CMA, am acting as scribe for Shirlee Latch, MD.  Chief Complaint  Patient presents with  . ER Follow Up   Subjective:    HPI     Follow up ER visit  Patient was seen at the [facility] ER for hypoglycemia, upper abd pain, palpitations and hemorrhoid on 10/09/2017. Sx occurred after she showered and lying in bed; states she was sweaty and was dizzy/confused. She checked her BS, which was 54 an hour after eating [facility].  She was treated for dizziness and hypoglycemia, which was believed to be caused by anxiety. She states she is self-managing her anxiety by understanding that her sx are anxiety, deep breathing, and other ways of "healing".  Treatment for this included checking labs to R/O thyroid dx. She has a family H/O thyroid dx, including a goitre in her mother. TSH was 4.13 in the ER, but no other thyroid function labs were checked. She states she is experiencing thyroid dx symptoms, including unexplained weight gain, hot flashes, palpitations. She is also c/o .  She was advised by the ER to FU with PCP about possible thyroid disease.  ------------------------------------------------------------------------------------    Allergies  Allergen Reactions  . Azithromycin Hives  . Penicillins Hives  . Sulfa Antibiotics Hives     Current Outpatient Medications:  .  clonazePAM (KLONOPIN) 1 MG tablet, Take 0.5-1 tablets (0.5-1 mg total) by mouth 2 (two) times daily as needed for anxiety., Disp: 45 tablet, Rfl: 2 .  FLUoxetine (PROZAC) 20 MG capsule, Take 3 capsules (60 mg total) by mouth daily., Disp: 90 capsule, Rfl: 2 .  lamoTRIgine (LAMICTAL) 25 MG tablet, take 2 tablets by mouth BEFORE A MEAL ONCE A DAY, Disp: , Rfl: 0 .  MILI 0.25-35 MG-MCG tablet, , Disp: , Rfl: 0 .  ranitidine (ZANTAC)  150 MG tablet, Take 1 tablet (150 mg total) by mouth 2 (two) times daily., Disp: 60 tablet, Rfl: 3 .  amphetamine-dextroamphetamine (ADDERALL) 10 MG tablet, Take 10 mg by mouth 2 (two) times daily., Disp: , Rfl: 0  Review of Systems  Constitutional: Positive for activity change (increased exercise), appetite change (increased), diaphoresis, fatigue and unexpected weight change (gain). Negative for chills and fever.  Cardiovascular: Positive for chest pain and palpitations. Negative for leg swelling.  Endocrine: Positive for cold intolerance. Negative for heat intolerance.    Social History   Tobacco Use  . Smoking status: Former Smoker    Packs/day: 0.25    Years: 11.00    Pack years: 2.75    Types: Cigarettes    Last attempt to quit: 05/25/2017    Years since quitting: 0.3  . Smokeless tobacco: Former Neurosurgeon    Types: Snuff    Quit date: 03/23/2016  . Tobacco comment: started smoking at age 3  Substance Use Topics  . Alcohol use: No   Objective:   BP 104/64 (BP Location: Right Arm, Patient Position: Sitting, Cuff Size: Large)   Pulse 80   Resp 16   Wt 215 lb (97.5 kg)   LMP 10/15/2017   SpO2 99%   BMI 40.62 kg/m  Vitals:   10/15/17 0849  BP: 104/64  Pulse: 80  Resp: 16  SpO2: 99%  Weight: 215 lb (97.5 kg)     Physical Exam  Constitutional: She is  oriented to person, place, and time. She appears well-developed and well-nourished. No distress.  HENT:  Head: Normocephalic and atraumatic.  Eyes: Conjunctivae and EOM are normal. Pupils are equal, round, and reactive to light. No scleral icterus.  Neck: Neck supple. No thyromegaly present.  Cardiovascular: Normal rate, regular rhythm, normal heart sounds and intact distal pulses.  No murmur heard. Pulmonary/Chest: Effort normal and breath sounds normal. No respiratory distress. She has no wheezes. She has no rales.  Abdominal: Soft. She exhibits no distension. There is no tenderness.  Musculoskeletal: She exhibits no  edema or deformity.  Lymphadenopathy:    She has no cervical adenopathy.  Neurological: She is alert and oriented to person, place, and time.  Skin: Skin is warm and dry. No rash noted.  Psychiatric: She has a normal mood and affect. Her behavior is normal.  Vitals reviewed.       Assessment & Plan:      Problem List Items Addressed This Visit      Endocrine   Hypoglycemia - Primary    One episode of hypoglycemia after high carb meal Suspect this was reaction to such high carb intake Will screen for diabets with A1c Advised moderate carb intake       Relevant Orders   Hemoglobin A1c   TSH   T4, free     Other   Family history of thyroid disease    Will recheck TSH and free T4, as she is scared Doubt this is related to hypoglycemia      Relevant Orders   TSH   T4, free       Return in about 6 months (around 04/14/2018) for physical.   The entirety of the information documented in the History of Present Illness, Review of Systems and Physical Exam were personally obtained by me. Portions of this information were initially documented by Irving BurtonEmily Ratchford, CMA and reviewed by me for thoroughness and accuracy.    Erasmo DownerBacigalupo, Durelle Zepeda M, MD, MPH [facility] 10/15/2017 11:58 AM

## 2017-10-16 LAB — TSH: TSH: 1.79 u[IU]/mL (ref 0.450–4.500)

## 2017-10-16 LAB — HEMOGLOBIN A1C
Est. average glucose Bld gHb Est-mCnc: 100 mg/dL
HEMOGLOBIN A1C: 5.1 % (ref 4.8–5.6)

## 2017-10-16 LAB — T4, FREE: Free T4: 1.07 ng/dL (ref 0.82–1.77)

## 2017-10-18 ENCOUNTER — Telehealth: Payer: Self-pay

## 2017-10-18 NOTE — Telephone Encounter (Signed)
Pt advised. States she saw a psychic who performed a reading on pt, and realized pt is going through unclear health issues. Pt states that the psychic informed her that she is having circulatory issues. Pt would like this worked up, and would possible like a referral. Please advise.

## 2017-10-18 NOTE — Telephone Encounter (Signed)
-----   Message from Erasmo DownerAngela M Bacigalupo, MD sent at 10/16/2017  9:34 AM EST ----- Normal A1c (no diabetes) and normal thyroid function  Beryle FlockBacigalupo, Marzella SchleinAngela M, MD, MPH [facility] 10/16/2017 9:34 AM

## 2017-10-19 NOTE — Telephone Encounter (Signed)
Tried calling pt. VM was full and could not leave a message. Will try again later.

## 2017-10-19 NOTE — Telephone Encounter (Signed)
I have never seen signs of a circulatory problem on exam and patient has strong distal pulses (hands and feet).  If she wants, we can do a full exam with checking all pulses and ruling this out at next visit.  Erasmo DownerBacigalupo, Angela M, MD, MPH [facility] 10/19/2017 9:00 AM

## 2017-10-22 NOTE — Telephone Encounter (Signed)
Tried calling pt again. NA and VM full.

## 2017-10-25 NOTE — Telephone Encounter (Signed)
Pt advised and agrees with plan. 

## 2017-12-09 ENCOUNTER — Ambulatory Visit: Payer: BLUE CROSS/BLUE SHIELD | Admitting: Family Medicine

## 2017-12-24 ENCOUNTER — Telehealth: Payer: Self-pay

## 2017-12-24 NOTE — Telephone Encounter (Signed)
Patient called saying that she has had a rash for the last 2 days that is located on her arms, back, chest and neck. She describes the rash as "red and burning". She denies any shortness of breath, dizziness, or sensation of throat closing. She reports that she has a burning sensation on the back of her head. She has not used anything OTC for her symptoms.  Dr. Leonard Schwartz was advised on patient's symptoms, and she recommended that the patient be seen for evaluation. If her symptoms worsen she needs to go to the ER.  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 I advised that we are open on Saturdays, but she will need to call on the day of to schedule an appt. Patient reports that she will give [place] a call back to see what she will do.

## 2017-12-25 ENCOUNTER — Ambulatory Visit: Payer: BLUE CROSS/BLUE SHIELD | Admitting: Physician Assistant

## 2017-12-25 ENCOUNTER — Encounter: Payer: Self-pay | Admitting: Physician Assistant

## 2017-12-25 VITALS — BP 122/86 | HR 92 | Temp 99.0°F | Resp 16 | Wt 226.0 lb

## 2017-12-25 DIAGNOSIS — L740 Miliaria rubra: Secondary | ICD-10-CM | POA: Diagnosis not present

## 2017-12-25 MED ORDER — PREDNISONE 10 MG PO TABS: ORAL_TABLET | ORAL | 0 refills | Status: DC

## 2017-12-25 NOTE — Patient Instructions (Signed)
Heat Rash, Adult Heat rash is an itchy rash of little red bumps that often occurs during hot, humid weather. Heat rash is also called prickly heat or miliaria. Heat rash usually affects:  Armpits.  Elbows.  Groin.  Neck.  The area underneath the breasts.  Shoulders.  Chest.  What are the causes? This condition is caused by blocked sweat ducts. When sweat is trapped under the skin, it spreads into surrounding tissues and causes a rash of red bumps. What increases the risk? This condition is more likely to develop in people who:  Are overdressed in hot, humid weather.  Wear clothing that rubs against the skin.  Are active in hot, humid weather.  Sweat a lot.  Are not used to hot, humid weather.  What are the signs or symptoms? Symptoms of this condition include:  Small red bumps that are itchy or prickly.  Very little sweating or no sweating in the affected area.  How is this diagnosed? This condition is diagnosed based on your symptoms and medical history, as well as a physical exam. How is this treated? Moving to a cool, dry place is the best treatment for heat rash. Treatment may also include medicines, such as:  Corticosteroid creams for skin irritation.  Antibiotic medicines, if the rash becomes infected.  Follow these instructions at home: Skin care  Keep the affected area dry.  Do not apply ointments or creams that contain mineral oil or petroleum ingredients to your skin. These can make the condition worse.  Apply cool compresses to the affected areas.  Do not scratch your skin.  Do not take hot showers or baths. General instructions  Take over-the-counter and prescription medicines only as told by your health care provider.  If you were prescribed an antibiotic, take it as told by your health care provider. Do not stop taking it even if your condition improves.  Stay in a cool room as much as possible. Use an air conditioner or fan, if  possible.  Do not wear tight clothes. Wear comfortable, loose-fitting clothing.  Keep all follow-up visits as told by your health care provider. This is important. Contact a health care provider if:  You have a fever.  Your rash does not go away after 3-4 days.  Your rash gets worse or it is very itchy.  Your rash has pus or fluid coming from it. Get help right away if:  You are dizzy or nauseated.  You feel confused.  You have trouble breathing.  You have chest pain.  You have muscle cramps or contractions.  You faint. Summary  Heat rash is an itchy rash of little red bumps that often occurs during hot, humid weather.  Symptoms of heat rash include small red bumps that are itchy or prickly and very little or no sweating in the affected area.  This condition is diagnosed based on your symptoms and medical history, as well as a physical exam.  Moving to a cool, dry place is the best treatment for heat rash.  Do not wear tight clothes. Wear comfortable, loose-fitting clothing. This information is not intended to replace advice given to you by your health care provider. Make sure you discuss any questions you have with your health care provider. Document Released: 07/29/2009 Document Revised: 10/21/2016 Document Reviewed: 10/21/2016 Elsevier Interactive Patient Education  2018 Elsevier Inc.  

## 2017-12-25 NOTE — Progress Notes (Signed)
Patient: Nicole Wade Female    DOB: 06-05-1991   27 y.o.   MRN: [id number] Visit Date: 12/25/2017  Today's Provider: Margaretann Loveless, PA-C   Chief Complaint  Patient presents with  . Rash   Subjective:    Rash  This is a new problem. Episode onset: 3 days. The problem has been gradually worsening since onset. The affected locations include the right arm, back, chest, neck and left arm. The rash is characterized by redness and burning. Associated with: discontinuation of Prozac  Pertinent negatives include no fatigue, fever, shortness of breath or vomiting.   Patient states he has been on Prozac since the age of 37. She was recently taken off of the medication 3 weeks ago. Patient thinks the rash may be related to withdrawal symptoms. Patient reports sun exposure made the rash on her chest and arms worse.   Rash started with increased sun and heat exposure. 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 She has not been wearing sunscreen recently. Rash is only in sun-exposed areas and has a prickly itch sensation associated.      Allergies  Allergen Reactions  . Azithromycin Hives  . Penicillins Hives  . Sulfa Antibiotics Hives     Current Outpatient Medications:  .  clonazePAM (KLONOPIN) 1 MG tablet, Take 0.5-1 tablets (0.5-1 mg total) by mouth 2 (two) times daily as needed for anxiety., Disp: 45 tablet, Rfl: 2 .  lamoTRIgine (LAMICTAL) 25 MG tablet, take 2 tablets by mouth BEFORE A MEAL ONCE A DAY, Disp: , Rfl: 0 .  MILI 0.25-35 MG-MCG tablet, , Disp: , Rfl: 0 .  ranitidine (ZANTAC) 150 MG tablet, Take 1 tablet (150 mg total) by mouth 2 (two) times daily., Disp: 60 tablet, Rfl: 3 .  amphetamine-dextroamphetamine (ADDERALL) 10 MG tablet, Take 10 mg by mouth 2 (two) times daily., Disp: , Rfl: 0 .  FLUoxetine (PROZAC) 20 MG capsule, Take 3 capsules (60 mg total) by mouth daily. (Patient not taking: Reported on 12/25/2017),  Disp: 90 capsule, Rfl: 2  Review of Systems  Constitutional: Negative for appetite change, chills, fatigue and fever.  Respiratory: Negative for chest tightness and shortness of breath.   Cardiovascular: Negative for chest pain and palpitations.  Gastrointestinal: Negative for abdominal pain, nausea and vomiting.  Skin: Positive for rash.  Neurological: Negative for dizziness and weakness.    Social History   Tobacco Use  . Smoking status: Current Every Day Smoker    Packs/day: 0.25    Years: 11.00    Pack years: 2.75    Types: Cigarettes    Last attempt to quit: 05/25/2017    Years since quitting: 0.5  . Smokeless tobacco: Former Neurosurgeon    Types: Snuff    Quit date: 03/23/2016  . Tobacco comment: started smoking at age 69  Substance Use Topics  . Alcohol use: No   Objective:   BP 122/86 (BP Location: Left Arm, Patient Position: Sitting, Cuff Size: Large)   Pulse 92   Temp 99 F (37.2 C) (Oral)   Resp 16   Wt 226 lb (102.5 kg)   SpO2 98% Comment: room air  BMI 42.70 kg/m  There were no vitals filed for this visit.   Physical Exam  Constitutional: She appears well-developed and well-nourished. No distress.  Neck: Normal range of motion. Neck supple.  Cardiovascular: Normal rate, regular rhythm and normal heart sounds. Exam reveals  no gallop and no friction rub.  No murmur heard. Pulmonary/Chest: Effort normal and breath sounds normal. No respiratory distress. She has no wheezes. She has no rales.  Skin: Rash noted. Rash is papular (diffuse papular rash in sun exposed areas (chest, upper back, arms and thighs) that is pruritic). She is not diaphoretic.  Vitals reviewed.       Assessment & Plan:     1. Heat rash Highly suspicious of Miliaria rash. Prednisone taper given as below. Advised patient to stay cool, stop tanning in tanning beds, use sunscreen. May use benadryl cream (patient does not want anything that will make her more drowsy since she takes clonazepam)  topically if needed. Continue Zantac. Cool baths may also help after sun exposure. She follows up with her psychiatrist on Tuesday and can discuss medication changes even though I do not feel they are playing a factor since she has been on Lamictal without dose change x 3 months and off Prozac x 3 weeks. She is to call if no improvement.  - predniSONE (DELTASONE) 10 MG tablet; Take 6 tabs PO on day 1&2, 5 tabs PO on day 3&4, 4 tabs PO on day 5&6, 3 tabs PO on day 7&8, 2 tabs PO on day 9&10, 1 tab PO on day 11&12.  Dispense: 42 tablet; Refill: 0       Margaretann Loveless, PA-C  [facility]

## 2017-12-28 ENCOUNTER — Other Ambulatory Visit: Payer: Self-pay | Admitting: Family Medicine

## 2017-12-29 NOTE — Telephone Encounter (Signed)
Patient is being seen by Psych now, who should be managing all of her psych medication including this one.  Erasmo Downer, MD, MPH [facility] 12/29/2017 8:27 AM

## 2018-01-07 ENCOUNTER — Encounter: Payer: Self-pay | Admitting: Family Medicine

## 2018-01-07 ENCOUNTER — Ambulatory Visit: Payer: BLUE CROSS/BLUE SHIELD | Admitting: Family Medicine

## 2018-01-07 VITALS — BP 120/76 | HR 97 | Temp 98.7°F | Resp 16 | Wt 226.0 lb

## 2018-01-07 DIAGNOSIS — R222 Localized swelling, mass and lump, trunk: Secondary | ICD-10-CM | POA: Diagnosis not present

## 2018-01-07 DIAGNOSIS — M79622 Pain in left upper arm: Secondary | ICD-10-CM | POA: Diagnosis not present

## 2018-01-07 DIAGNOSIS — Z6841 Body Mass Index (BMI) 40.0 and over, adult: Secondary | ICD-10-CM | POA: Diagnosis not present

## 2018-01-07 DIAGNOSIS — R2230 Localized swelling, mass and lump, unspecified upper limb: Secondary | ICD-10-CM

## 2018-01-07 NOTE — Progress Notes (Signed)
Patient: Nicole Wade Female    DOB: 1991/05/13   27 y.o.   MRN: [id number] Visit Date: 01/07/2018  Today's Provider: Shirlee Latch, MD   I, Joslyn Hy, CMA, am acting as scribe for Shirlee Latch, MD.  Chief Complaint  Patient presents with  . Axilla Pain   Subjective:    HPI   Pt is c/o left axillary pain. States this is a sharp pain that radiates into back, shoulder and chest.  She states that the pain is been present and constant since after taking prednisone about 3 weeks ago for a rash.  She says it hurts to put her arm down and feels as if there is a tennis ball in her axilla.  It hurts along the underside of her arm to her wrist and down the side and across her chest intermittently.  She has no known injury and never had anything like this before.  She denies numbness, tingling, weakness, fevers, further rash.  She is concerned because her BP has been elevated when she checks it at [facility], and she notices flushing of her face.  She states things are going well with her psychiatrist.  She stopped prozac with some difficulty.  She has had return of her obsessive thoughts and racing thoughts and worsening anxiety.  She states she has had a lot of somatic symptoms from her anxiety recently.  They are considering putting her back on Prozac as she is not doing well off of it.  She did do the salivary test for pharmacogenetics to find a good SSRI for her.  She states she will have these records sent to [place].  Patient is also very discouraged by her weight gain.  She has gained 40 pounds since she started seeing me in 04/2017.  She states she is doing "everything right.  "She is eating a low-carb diet and exercising 5 days a week.  She does not know why she continues to gain weight.   Allergies  Allergen Reactions  . Azithromycin Hives  . Penicillins Hives  . Sulfa Antibiotics Hives  . Prednisone Palpitations    Caused mental distress     Current  Outpatient Medications:  .  clonazePAM (KLONOPIN) 1 MG tablet, Take 0.5-1 tablets (0.5-1 mg total) by mouth 2 (two) times daily as needed for anxiety., Disp: 45 tablet, Rfl: 2 .  lamoTRIgine (LAMICTAL) 200 MG tablet, Take 200 mg by mouth at bedtime., Disp: , Rfl: 0 .  MILI 0.25-35 MG-MCG tablet, , Disp: , Rfl: 0 .  ranitidine (ZANTAC) 150 MG tablet, Take 1 tablet (150 mg total) by mouth 2 (two) times daily., Disp: 60 tablet, Rfl: 3 .  VYVANSE 30 MG capsule, , Disp: , Rfl: 0 .  FLUoxetine (PROZAC) 10 MG capsule, take 1 capsule by mouth once daily before meals, Disp: , Rfl: 0  Review of Systems  Constitutional: Positive for appetite change, diaphoresis and unexpected weight change. Negative for activity change, chills, fatigue and fever.  Respiratory: Negative for shortness of breath.   Cardiovascular: Positive for chest pain. Negative for palpitations and leg swelling.    Social History   Tobacco Use  . Smoking status: Former Smoker    Years: 11.00    Last attempt to quit: 12/24/2017    Years since quitting: 0.0  . Smokeless tobacco: Former Neurosurgeon    Types: Snuff    Quit date: 03/23/2016  . Tobacco comment: started smoking at age 58  Substance Use Topics  .  Alcohol use: No   Objective:   BP 120/76 (BP Location: Left Arm, Patient Position: Sitting, Cuff Size: Large)   Pulse 97   Temp 98.7 F (37.1 C) (Oral)   Resp 16   Wt 226 lb (102.5 kg)   SpO2 97%   BMI 42.70 kg/m  Vitals:   01/07/18 0805  BP: 120/76  Pulse: 97  Resp: 16  Temp: 98.7 F (37.1 C)  TempSrc: Oral  SpO2: 97%  Weight: 226 lb (102.5 kg)     Physical Exam  Constitutional: She is oriented to person, place, and time. She appears well-developed and well-nourished. No distress.  HENT:  Head: Normocephalic and atraumatic.  Mouth/Throat: No oropharyngeal exudate.  Eyes: Conjunctivae are normal.  Cardiovascular: Normal rate, regular rhythm, normal heart sounds and intact distal pulses.  No murmur  heard. Pulmonary/Chest: Effort normal and breath sounds normal. No respiratory distress. She has no wheezes. She has no rales.  Musculoskeletal: She exhibits no edema or deformity.  Diffuse TTP over L shoulder. ROM intact.   Palpable fullness of L axilla without discreet lymph nodes  Lymphadenopathy:    She has no cervical adenopathy.  Neurological: She is alert and oriented to person, place, and time.  Skin: Skin is warm and dry. Capillary refill takes less than 2 seconds. No rash noted.  Psychiatric: She has a normal mood and affect. Her behavior is normal.  Vitals reviewed.       Assessment & Plan:   1. Left axillary pain 2. Axillary fullness -Patient with left axillary pain and fullness that is palpable on exam -Unable to palpate any discrete lymph nodes in the area -Obtain ultrasound to further evaluate - Management pending ultrasound results - [place] AXILLA LEFT; Future  3. Class 3 severe obesity without serious comorbidity with body mass index (BMI) of 40.0 to 44.9 in adult, unspecified obesity type [facility]) -Patient believes she is doing very well with her diet and exercise and she would like to see a specialist about weight management - I know that we have a obesity medicine specialist in [place], patient lives in [place], so will request referral - Amb Ref to Medical Weight Management   Return if symptoms worsen or fail to improve.   The entirety of the information documented in the History of Present Illness, Review of Systems and Physical Exam were personally obtained by me. Portions of this information were initially documented by Irving Burton Ratchford, CMA and reviewed by me for thoroughness and accuracy.    Erasmo Downer, MD, MPH [facility] 01/07/2018 9:03 AM

## 2018-01-11 ENCOUNTER — Telehealth: Payer: Self-pay | Admitting: Family Medicine

## 2018-01-11 DIAGNOSIS — R222 Localized swelling, mass and lump, trunk: Secondary | ICD-10-CM

## 2018-01-11 DIAGNOSIS — R2230 Localized swelling, mass and lump, unspecified upper limb: Secondary | ICD-10-CM

## 2018-01-11 DIAGNOSIS — M79622 Pain in left upper arm: Secondary | ICD-10-CM

## 2018-01-11 NOTE — Telephone Encounter (Signed)
Per [facility] order for ultrasound of axilla will need to be changed to Left breast ultrasound.[id number]

## 2018-01-11 NOTE — Telephone Encounter (Signed)
Reordered as directed.

## 2018-01-14 ENCOUNTER — Telehealth: Payer: Self-pay | Admitting: Family Medicine

## 2018-01-14 DIAGNOSIS — R200 Anesthesia of skin: Secondary | ICD-10-CM

## 2018-01-14 DIAGNOSIS — R202 Paresthesia of skin: Principal | ICD-10-CM

## 2018-01-14 NOTE — Telephone Encounter (Signed)
Pt called saying she just went to [facility] to get an [place] of her armpit.  She was told that it was negative but they do want Dr. B to do a referral to neurology.  Pt's call back is [phone]  Thanks teri

## 2018-01-14 NOTE — Telephone Encounter (Signed)
Please review

## 2018-01-18 ENCOUNTER — Other Ambulatory Visit: Payer: Self-pay

## 2018-01-19 DIAGNOSIS — R202 Paresthesia of skin: Principal | ICD-10-CM

## 2018-01-19 DIAGNOSIS — R200 Anesthesia of skin: Secondary | ICD-10-CM | POA: Insufficient documentation

## 2018-01-19 NOTE — Telephone Encounter (Signed)
Tried calling pt to inform her of this. NA and VM was full.

## 2018-01-19 NOTE — Telephone Encounter (Signed)
Referral was placed for [facility] Neurology.  Please let patient know.  Erasmo Downer, MD, MPH [facility] 01/19/2018 8:43 AM

## 2018-01-28 ENCOUNTER — Ambulatory Visit: Payer: Self-pay

## 2018-02-15 ENCOUNTER — Telehealth: Payer: Self-pay | Admitting: Family Medicine

## 2018-02-15 NOTE — Telephone Encounter (Signed)
Pt refused referral to neurology for numbness in left arm stating that she made appointment to a chiropractor

## 2018-02-15 NOTE — Telephone Encounter (Signed)
FYI

## 2018-02-17 ENCOUNTER — Other Ambulatory Visit: Payer: Self-pay | Admitting: Family Medicine

## 2018-02-28 NOTE — Telephone Encounter (Signed)
Noted.  However, chiropractors and neurologists treat very different things.  If she still has numbness of her arm, she should see a neurologist.  Erasmo DownerBacigalupo, Yaritzel Stange M, MD, MPH [facility] 02/28/2018 8:58 AM

## 2018-03-07 ENCOUNTER — Other Ambulatory Visit: Payer: Self-pay | Admitting: Physician Assistant

## 2018-03-09 NOTE — Telephone Encounter (Signed)
We had not discussed a weight loss medication, but she did have a referral for Obesity medicine pending (appears she wanted to see Bariatric Surgery in [place]).  OK to place psych referral, but I believe this was placed in the past.  [place] in [place] or [place] now?  Erasmo DownerBacigalupo, Angela M, MD, MPH [facility] 03/09/2018 2:11 PM

## 2018-03-09 NOTE — Telephone Encounter (Signed)
Pt states the numbness is improving with chiropractic manipulation. While on the phone, pt mentions she had been waiting on "something for weight loss", as well as a psych referral. Do not see anything in the chart about either of these. Please review.

## 2018-03-10 NOTE — Telephone Encounter (Signed)
Tried calling pt; VM was full. Could not leave message.

## 2018-04-05 ENCOUNTER — Encounter: Payer: Self-pay | Admitting: Family Medicine

## 2018-04-05 ENCOUNTER — Ambulatory Visit: Payer: BLUE CROSS/BLUE SHIELD | Admitting: Family Medicine

## 2018-04-05 VITALS — BP 122/82 | HR 88 | Temp 98.6°F | Wt 229.2 lb

## 2018-04-05 DIAGNOSIS — K219 Gastro-esophageal reflux disease without esophagitis: Secondary | ICD-10-CM | POA: Diagnosis not present

## 2018-04-05 MED ORDER — OMEPRAZOLE 20 MG PO CPDR: 20.0000 mg | DELAYED_RELEASE_CAPSULE | Freq: Every day | ORAL | 3 refills | Status: DC

## 2018-04-05 NOTE — Patient Instructions (Signed)
Food Choices for Gastroesophageal Reflux Disease, Adult When you have gastroesophageal reflux disease (GERD), the foods you eat and your eating habits are very important. Choosing the right foods can help ease your discomfort. What guidelines do I need to follow?  Choose fruits, vegetables, whole grains, and low-fat dairy products.  Choose low-fat meat, fish, and poultry.  Limit fats such as oils, salad dressings, butter, nuts, and avocado.  Keep a food diary. This helps you identify foods that cause symptoms.  Avoid foods that cause symptoms. These may be different for everyone.  Eat small meals often instead of 3 large meals a day.  Eat your meals slowly, in a place where you are relaxed.  Limit fried foods.  Cook foods using methods other than frying.  Avoid drinking alcohol.  Avoid drinking large amounts of liquids with your meals.  Avoid bending over or lying down until 2-3 hours after eating. What foods are not recommended? These are some foods and drinks that may make your symptoms worse: Vegetables  Tomatoes. Tomato juice. Tomato and spaghetti sauce. Chili peppers. Onion and garlic. Horseradish. Fruits  Oranges, grapefruit, and lemon (fruit and juice). Meats  High-fat meats, fish, and poultry. This includes hot dogs, ribs, ham, sausage, salami, and bacon. Dairy  Whole milk and chocolate milk. Sour cream. Cream. Butter. Ice cream. Cream cheese. Drinks  Coffee and tea. Bubbly (carbonated) drinks or energy drinks. Condiments  Hot sauce. Barbecue sauce. Sweets/Desserts  Chocolate and cocoa. Donuts. Peppermint and spearmint. Fats and Oils  High-fat foods. This includes French fries and potato chips. Other  Vinegar. Strong spices. This includes black pepper, white pepper, red pepper, cayenne, curry powder, cloves, ginger, and chili powder. The items listed above may not be a complete list of foods and drinks to avoid. Contact your dietitian for more information.    This information is not intended to replace advice given to you by your health care provider. Make sure you discuss any questions you have with your health care provider. Document Released: 02/09/2012 Document Revised: 01/16/2016 Document Reviewed: 06/14/2013 Elsevier Interactive Patient Education  2017 Elsevier Inc.  

## 2018-04-05 NOTE — Assessment & Plan Note (Signed)
Uncontrolled Zantac is not helping her symptoms We will escalate to PPI with Prilosec 20 mg daily As requested by patient, will send referral to GI, but she may improve on PPI therapy and not need to be seen by GI Return precautions discussed

## 2018-04-05 NOTE — Progress Notes (Signed)
Patient: Nicole PattenJade Elizabeth Causey Female    DOB: Mar 12, 1991   27 y.o.   MRN: [id number] Visit Date: 04/05/2018  Today's Provider: Shirlee LatchAngela Nicklas Mcsweeney, MD   Chief Complaint  Patient presents with  . Gastroesophageal Reflux   Subjective:    Gastroesophageal Reflux  She complains of abdominal pain, belching, chest pain, heartburn and nausea. She reports no dysphagia or no sore throat. Choking: sensation of "somethimg in her throat" This is a new problem. Episode onset: approximately a couple weeks ago. The problem occurs constantly. The problem has been gradually worsening. The heartburn does not wake her from sleep. The symptoms are aggravated by lying down. Associated symptoms include fatigue. Pertinent negatives include no anemia, muscle weakness or weight loss. Risk factors include obesity, smoking/tobacco exposure and lack of exercise. Treatments tried: Zantac. Improvement on treatment: helped at first but none does not help.  Has also tried Tums, Alka Seltzer, and Pepto Bismol in the past.  Without great relief.  She is requesting a referral to GI.     Allergies  Allergen Reactions  . Azithromycin Hives  . Penicillins Hives  . Sulfa Antibiotics Hives  . Prednisone Palpitations    Caused mental distress     Current Outpatient Medications:  .  clonazePAM (KLONOPIN) 1 MG tablet, Take 0.5-1 tablets (0.5-1 mg total) by mouth 2 (two) times daily as needed for anxiety., Disp: 45 tablet, Rfl: 2 .  FLUoxetine (PROZAC) 10 MG capsule, take 1 capsule by mouth once daily before meals, Disp: , Rfl: 0 .  lamoTRIgine (LAMICTAL) 200 MG tablet, Take 200 mg by mouth at bedtime., Disp: , Rfl: 0 .  MILI 0.25-35 MG-MCG tablet, , Disp: , Rfl: 0 .  UNABLE TO FIND, Med Name: L Methyldopate 15 mg, Disp: , Rfl:  .  VYVANSE 30 MG capsule, , Disp: , Rfl: 0 .  omeprazole (PRILOSEC) 20 MG capsule, Take 1 capsule (20 mg total) by mouth daily., Disp: 30 capsule, Rfl: 3  Review of Systems    Constitutional: Positive for fatigue. Negative for weight loss.  HENT: Negative.  Negative for sore throat.   Respiratory: Negative.  Choking: sensation of "somethimg in her throat"   Cardiovascular: Positive for chest pain. Negative for palpitations and leg swelling.  Gastrointestinal: Positive for abdominal pain, heartburn and nausea. Negative for abdominal distention, anal bleeding, blood in stool, constipation, diarrhea, dysphagia, rectal pain and vomiting.  Genitourinary: Negative.   Musculoskeletal: Negative for muscle weakness.  Neurological: Positive for dizziness. Negative for tremors, seizures, syncope, facial asymmetry, speech difficulty, weakness, light-headedness, numbness and headaches.    Social History   Tobacco Use  . Smoking status: Former Smoker    Years: 11.00    Last attempt to quit: 12/24/2017    Years since quitting: 0.2  . Smokeless tobacco: Former NeurosurgeonUser    Types: Snuff    Quit date: 03/23/2016  . Tobacco comment: started smoking at age 27  Substance Use Topics  . Alcohol use: No   Objective:   BP 122/82 (BP Location: Right Arm, Patient Position: Sitting, Cuff Size: Large)   Pulse 88   Temp 98.6 F (37 C) (Oral)   Wt 229 lb 3.2 oz (104 kg)   SpO2 99%   BMI 43.31 kg/m    Physical Exam  Constitutional: She is oriented to person, place, and time. She appears well-developed and well-nourished. No distress.  HENT:  Head: Normocephalic and atraumatic.  Mouth/Throat: Oropharynx is clear and moist.  Eyes: Pupils  are equal, round, and reactive to light. Conjunctivae are normal. No scleral icterus.  Neck: Neck supple. No thyromegaly present.  Cardiovascular: Normal rate, regular rhythm, normal heart sounds and intact distal pulses.  No murmur heard. Pulmonary/Chest: Effort normal and breath sounds normal. No respiratory distress. She has no wheezes. She has no rales. She exhibits tenderness.  Abdominal: Soft. Bowel sounds are normal. She exhibits no  distension. There is no tenderness.  Musculoskeletal: She exhibits no edema.  Lymphadenopathy:    She has no cervical adenopathy.  Neurological: She is alert and oriented to person, place, and time.  Skin: Skin is warm and dry. Capillary refill takes less than 2 seconds. No rash noted.  Psychiatric: She has a normal mood and affect. Her behavior is normal.  Vitals reviewed.      Assessment & Plan:   Problem List Items Addressed This Visit      Digestive   Gastroesophageal reflux disease - Primary    Uncontrolled Zantac is not helping her symptoms We will escalate to PPI with Prilosec 20 mg daily As requested by patient, will send referral to GI, but she may improve on PPI therapy and not need to be seen by GI Return precautions discussed      Relevant Medications   omeprazole (PRILOSEC) 20 MG capsule   Other Relevant Orders   Ambulatory referral to Gastroenterology       Return if symptoms worsen or fail to improve.   The entirety of the information documented in the History of Present Illness, Review of Systems and Physical Exam were personally obtained by me. Portions of this information were initially documented by [name], CMA and reviewed by me for thoroughness and accuracy.    Erasmo DownerBacigalupo, Buffi Ewton M, MD, MPH [facility] 04/05/2018 9:40 AM

## 2018-04-19 ENCOUNTER — Encounter: Payer: Self-pay | Admitting: Family Medicine

## 2018-04-19 ENCOUNTER — Ambulatory Visit (INDEPENDENT_AMBULATORY_CARE_PROVIDER_SITE_OTHER): Payer: BLUE CROSS/BLUE SHIELD | Admitting: Family Medicine

## 2018-04-19 VITALS — BP 124/82 | HR 100 | Temp 98.7°F | Ht 61.0 in | Wt 228.2 lb

## 2018-04-19 DIAGNOSIS — Z0000 Encounter for general adult medical examination without abnormal findings: Secondary | ICD-10-CM

## 2018-04-19 DIAGNOSIS — Z23 Encounter for immunization: Secondary | ICD-10-CM | POA: Diagnosis not present

## 2018-04-19 DIAGNOSIS — Z8349 Family history of other endocrine, nutritional and metabolic diseases: Secondary | ICD-10-CM

## 2018-04-19 MED ORDER — MILI 0.25-35 MG-MCG PO TABS: 1.0000 | ORAL_TABLET | Freq: Every day | ORAL | 3 refills | Status: DC

## 2018-04-19 NOTE — Patient Instructions (Signed)
Preventive Care 18-39 Years, Female Preventive care refers to lifestyle choices and visits with your health care provider that can promote health and wellness. What does preventive care include?  A yearly physical exam. This is also called an annual well check.  Dental exams once or twice a year.  Routine eye exams. Ask your health care provider how often you should have your eyes checked.  Personal lifestyle choices, including: ? Daily care of your teeth and gums. ? Regular physical activity. ? Eating a healthy diet. ? Avoiding tobacco and drug use. ? Limiting alcohol use. ? Practicing safe sex. ? Taking vitamin and mineral supplements as recommended by your health care provider. What happens during an annual well check? The services and screenings done by your health care provider during your annual well check will depend on your age, overall health, lifestyle risk factors, and family history of disease. Counseling Your health care provider may ask you questions about your:  Alcohol use.  Tobacco use.  Drug use.  Emotional well-being.  Home and relationship well-being.  Sexual activity.  Eating habits.  Work and work Statistician.  Method of birth control.  Menstrual cycle.  Pregnancy history.  Screening You may have the following tests or measurements:  Height, weight, and BMI.  Diabetes screening. This is done by checking your blood sugar (glucose) after you have not eaten for a while (fasting).  Blood pressure.  Lipid and cholesterol levels. These may be checked every 5 years starting at age 66.  Skin check.  Hepatitis C blood test.  Hepatitis B blood test.  Sexually transmitted disease (STD) testing.  BRCA-related cancer screening. This may be done if you have a family history of breast, ovarian, tubal, or peritoneal cancers.  Pelvic exam and Pap test. This may be done every 3 years starting at age 40. Starting at age 59, this may be done every 5  years if you have a Pap test in combination with an HPV test.  Discuss your test results, treatment options, and if necessary, the need for more tests with your health care provider. Vaccines Your health care provider may recommend certain vaccines, such as:  Influenza vaccine. This is recommended every year.  Tetanus, diphtheria, and acellular pertussis (Tdap, Td) vaccine. You may need a Td booster every 10 years.  Varicella vaccine. You may need this if you have not been vaccinated.  HPV vaccine. If you are 69 or younger, you may need three doses over 6 months.  Measles, mumps, and rubella (MMR) vaccine. You may need at least one dose of MMR. You may also need a second dose.  Pneumococcal 13-valent conjugate (PCV13) vaccine. You may need this if you have certain conditions and were not previously vaccinated.  Pneumococcal polysaccharide (PPSV23) vaccine. You may need one or two doses if you smoke cigarettes or if you have certain conditions.  Meningococcal vaccine. One dose is recommended if you are age 27-21 years and a first-year college student living in a residence hall, or if you have one of several medical conditions. You may also need additional booster doses.  Hepatitis A vaccine. You may need this if you have certain conditions or if you travel or work in places where you may be exposed to hepatitis A.  Hepatitis B vaccine. You may need this if you have certain conditions or if you travel or work in places where you may be exposed to hepatitis B.  Haemophilus influenzae type b (Hib) vaccine. You may need this if  you have certain risk factors.  Talk to your health care provider about which screenings and vaccines you need and how often you need them. This information is not intended to replace advice given to you by your health care provider. Make sure you discuss any questions you have with your health care provider. Document Released: 10/06/2001 Document Revised: 04/29/2016  Document Reviewed: 06/11/2015 Elsevier Interactive Patient Education  [employer].

## 2018-04-19 NOTE — Progress Notes (Signed)
Patient: Nicole Wade, Female    DOB: 12-07-1990, 27 y.o.   MRN: [id number] Visit Date: 04/19/2018  Today's Provider: Shirlee Latch, MD   Chief Complaint  Patient presents with  . Annual Exam   Subjective:  I, Presley Raddle, CMA, am acting as a scribe for Shirlee Latch, MD.    Annual physical exam Nicole Wade is a 27 y.o. female who presents today for health maintenance and complete physical. She feels well. She reports exercising lightly. She reports she is sleeping poorly.  ----------------------------------------------------------------- Pap: 04/05/2015. Patient is scheduled in October at [facility]. She believes she has had a pap smear within the last 2 years.  She has called [facility] regarding appt to possible weight loss surgery.  She is disappointed by her inability to lose weight despite intermittent fasting, decreased portions, and exercise.  Review of Systems  Constitutional: Positive for appetite change.       Irritability   HENT: Negative.   Eyes: Negative.   Respiratory: Positive for chest tightness and shortness of breath.   Cardiovascular: Positive for chest pain.  Gastrointestinal: Positive for blood in stool.  Endocrine: Positive for polyphagia.  Genitourinary: Negative.   Musculoskeletal: Positive for back pain, neck pain and neck stiffness.  Skin: Negative.   Allergic/Immunologic: Negative.   Neurological: Positive for dizziness, light-headedness and headaches.  Hematological: Negative.   Psychiatric/Behavioral: The patient is nervous/anxious.     Social History      She  reports that she quit smoking about 3 months ago. She quit after 11.00 years of use. She quit smokeless tobacco use about 2 years ago.  Her smokeless tobacco use included snuff. She reports that she has current or past drug history. Drug: Marijuana. She reports that she does not drink alcohol.       Social History    Socioeconomic History  . Marital status: Single    Spouse name: Not on file  . Number of children: 0  . Years of education: bachelor's  . Highest education level: Not on file  Occupational History  . Occupation: Comptroller: [employer]  Social Needs  . Financial resource strain: Not on file  . Food insecurity:    Worry: Not on file    Inability: Not on file  . Transportation needs:    Medical: Not on file    Non-medical: Not on file  Tobacco Use  . Smoking status: Former Smoker    Years: 11.00    Last attempt to quit: 12/24/2017    Years since quitting: 0.3  . Smokeless tobacco: Former Neurosurgeon    Types: Snuff    Quit date: 03/23/2016  . Tobacco comment: started smoking at age 2  Substance and Sexual Activity  . Alcohol use: No  . Drug use: Yes    Types: Marijuana  . Sexual activity: Not Currently  Lifestyle  . Physical activity:    Days per week: Not on file    Minutes per session: Not on file  . Stress: Not on file  Relationships  . Social connections:    Talks on phone: Not on file    Gets together: Not on file    Attends religious service: Not on file    Active member of club or organization: Not on file    Attends meetings of clubs or organizations: Not on file    Relationship status: Not on file  Other Topics Concern  . Not  on file  Social History Narrative  . Not on file    Past Medical History:  Diagnosis Date  . ADHD   . Allergic rhinitis   . Anxiety   . Depression   . OCD (obsessive compulsive disorder)   . Panic disorder      Patient Active Problem List   Diagnosis Date Noted  . Morbid obesity (HCC) 09/09/2017  . Gastroesophageal reflux disease 05/27/2017  . Family history of thyroid disease 05/25/2017  . Panic disorder   . OCD (obsessive compulsive disorder)   . Depression   . Anxiety   . Allergic rhinitis   . ADHD     Past Surgical History:  Procedure Laterality Date  . TOOTH EXTRACTION  2010   wisdom  teeth    Family History        Family Status  Relation Name Status  . Mother  Alive  . Father  Alive  . Brother  Alive  . MGF  Deceased  . PGF  Alive  . Emelda BrothersPat Aunt  (Not Specified)  . Neg Hx  (Not Specified)        Her family history includes Alcohol abuse in her mother; Anxiety disorder in her brother, father, and mother; Breast cancer in her paternal aunt; CVA (age of onset: 7560) in her paternal grandfather; Depression in her brother, father, and mother; Diabetes in her paternal grandfather; Heart attack (age of onset: 1460) in her paternal grandfather; Heart attack (age of onset: 5265) in her maternal grandfather; Hypertension in her father; Hypothyroidism in her mother. There is no history of Colon cancer or Cervical cancer.      Allergies  Allergen Reactions  . Azithromycin Hives  . Penicillins Hives  . Sulfa Antibiotics Hives  . Prednisone Palpitations    Caused mental distress     Current Outpatient Medications:  .  clonazePAM (KLONOPIN) 1 MG tablet, Take 0.5-1 tablets (0.5-1 mg total) by mouth 2 (two) times daily as needed for anxiety., Disp: 45 tablet, Rfl: 2 .  FLUoxetine (PROZAC) 10 MG capsule, take 1 capsule by mouth once daily before meals, Disp: , Rfl: 0 .  lamoTRIgine (LAMICTAL) 200 MG tablet, Take 200 mg by mouth at bedtime., Disp: , Rfl: 0 .  MILI 0.25-35 MG-MCG tablet, Take 1 tablet by mouth daily. Skip placebo pills, take continuously, Disp: 4 Package, Rfl: 3 .  omeprazole (PRILOSEC) 20 MG capsule, Take 1 capsule (20 mg total) by mouth daily., Disp: 30 capsule, Rfl: 3 .  UNABLE TO FIND, Med Name: L Methyldopate 15 mg, Disp: , Rfl:  .  VYVANSE 30 MG capsule, , Disp: , Rfl: 0   Patient Care Team: Erasmo DownerBacigalupo, Johnice Riebe M, MD as PCP - General (Family Medicine)      Objective:   Vitals: BP 124/82 (BP Location: Right Arm, Patient Position: Sitting, Cuff Size: Large)   Pulse 100   Temp 98.7 F (37.1 C) (Oral)   Ht 5\' 1"  (1.549 m)   Wt 228 lb 3.2 oz (103.5 kg)   LMP   (LMP Unknown)   SpO2 99%   BMI 43.12 kg/m    Vitals:   04/19/18 0908  BP: 124/82  Pulse: 100  Temp: 98.7 F (37.1 C)  TempSrc: Oral  SpO2: 99%  Weight: 228 lb 3.2 oz (103.5 kg)  Height: 5\' 1"  (1.549 m)     Physical Exam  Constitutional: She is oriented to person, place, and time. She appears well-developed and well-nourished. No distress.  HENT:  Head:  Normocephalic and atraumatic.  Right Ear: External ear normal.  Left Ear: External ear normal.  Nose: Nose normal.  Mouth/Throat: Oropharynx is clear and moist.  Eyes: Pupils are equal, round, and reactive to light. Conjunctivae and EOM are normal. No scleral icterus.  Neck: Neck supple. No thyromegaly present.  Cardiovascular: Normal rate, regular rhythm, normal heart sounds and intact distal pulses.  No murmur heard. Pulmonary/Chest: Effort normal and breath sounds normal. No respiratory distress. She has no wheezes. She has no rales.  Abdominal: Soft. Bowel sounds are normal. She exhibits no distension. There is no tenderness. There is no rebound and no guarding.  Musculoskeletal: She exhibits no edema or deformity.  Lymphadenopathy:    She has no cervical adenopathy.  Neurological: She is alert and oriented to person, place, and time.  Skin: Skin is warm and dry. Capillary refill takes less than 2 seconds. No rash noted.  Psychiatric: She has a normal mood and affect. Her behavior is normal.  Vitals reviewed.    Depression Screen PHQ 2/9 Scores 04/19/2018 04/05/2018 09/09/2017 07/07/2017  PHQ - 2 Score 1 1 6 3   PHQ- 9 Score 7 8 21 16       Assessment & Plan:     Routine Health Maintenance and Physical Exam  Exercise Activities and Dietary recommendations Goals   None     Immunization History  Administered Date(s) Administered  . Tdap 04/19/2018    Health Maintenance  Topic Date Due  . PAP SMEAR  04/04/2018  . INFLUENZA VACCINE  12/07/2018 (Originally 03/24/2018)  . TETANUS/TDAP  04/19/2028  . HIV  Screening  Completed     Discussed health benefits of physical activity, and encouraged her to engage in regular exercise appropriate for her age and condition.    --------------------------------------------------------------------  Problem List Items Addressed This Visit      Other   Family history of thyroid disease   Relevant Orders   TSH   Morbid obesity (HCC)   Relevant Orders   Lipid panel   TSH   CBC   Comprehensive metabolic panel    Other Visit Diagnoses    Encounter for annual physical exam    -  Primary   Relevant Orders   Lipid panel   TSH   CBC   Comprehensive metabolic panel       Return in about 1 year (around 04/20/2019) for CPE.   The entirety of the information documented in the History of Present Illness, Review of Systems and Physical Exam were personally obtained by me. Portions of this information were initially documented by Presley Raddle, CMA and reviewed by me for thoroughness and accuracy.    Erasmo Downer, MD, MPH [facility] 04/19/2018 10:07 AM

## 2018-04-20 ENCOUNTER — Telehealth: Payer: Self-pay

## 2018-04-20 LAB — COMPREHENSIVE METABOLIC PANEL
ALBUMIN: 4.5 g/dL (ref 3.5–5.5)
ALT: 8 IU/L (ref 0–32)
AST: 9 IU/L (ref 0–40)
Albumin/Globulin Ratio: 2 (ref 1.2–2.2)
Alkaline Phosphatase: 59 IU/L (ref 39–117)
BUN / CREAT RATIO: 10 (ref 9–23)
BUN: 9 mg/dL (ref 6–20)
Bilirubin Total: 0.2 mg/dL (ref 0.0–1.2)
CALCIUM: 9.4 mg/dL (ref 8.7–10.2)
CO2: 22 mmol/L (ref 20–29)
CREATININE: 0.93 mg/dL (ref 0.57–1.00)
Chloride: 103 mmol/L (ref 96–106)
GFR calc Af Amer: 97 mL/min/{1.73_m2} (ref >59)
GFR, EST NON AFRICAN AMERICAN: 84 mL/min/{1.73_m2} (ref >59)
GLOBULIN, TOTAL: 2.2 g/dL (ref 1.5–4.5)
Glucose: 90 mg/dL (ref 65–99)
Potassium: 4.5 mmol/L (ref 3.5–5.2)
SODIUM: 140 mmol/L (ref 134–144)
Total Protein: 6.7 g/dL (ref 6.0–8.5)

## 2018-04-20 LAB — LIPID PANEL
Chol/HDL Ratio: 3 ratio (ref 0.0–4.4)
Cholesterol, Total: 168 mg/dL (ref 100–199)
HDL: 56 mg/dL (ref >39)
LDL Calculated: 95 mg/dL (ref 0–99)
Triglycerides: 83 mg/dL (ref 0–149)
VLDL Cholesterol Cal: 17 mg/dL (ref 5–40)

## 2018-04-20 LAB — CBC
HEMATOCRIT: 39.8 % (ref 34.0–46.6)
HEMOGLOBIN: 13.2 g/dL (ref 11.1–15.9)
MCH: 28.8 pg (ref 26.6–33.0)
MCHC: 33.2 g/dL (ref 31.5–35.7)
MCV: 87 fL (ref 79–97)
Platelets: 322 10*3/uL (ref 150–450)
RBC: 4.59 x10E6/uL (ref 3.77–5.28)
RDW: 13.2 % (ref 12.3–15.4)
WBC: 5.9 10*3/uL (ref 3.4–10.8)

## 2018-04-20 LAB — TSH: TSH: 1.08 u[IU]/mL (ref 0.450–4.500)

## 2018-04-20 NOTE — Telephone Encounter (Signed)
-----   Message from Erasmo DownerAngela M Bacigalupo, MD sent at 04/20/2018  9:35 AM EDT ----- Normal labs, including cholesterol, thyroid function, blood sugar, blood counts, kidney function, liver function, electrolytes.  Erasmo DownerBacigalupo, Angela M, MD, MPH [facility] 04/20/2018 9:35 AM

## 2018-04-20 NOTE — Telephone Encounter (Signed)
Attempted to contact patient. No answer and unable to leave a message. Voicemail is full.  

## 2018-04-20 NOTE — Telephone Encounter (Signed)
Pt advised.   Thanks,   -Nicole Wade  

## 2018-05-02 ENCOUNTER — Telehealth: Payer: Self-pay | Admitting: Family Medicine

## 2018-05-02 NOTE — Telephone Encounter (Signed)
Patient wasn't sick when she was last seen.  Recommend OTC therapy with decongestant, like Mucinex, and nasal spray like flonase or nasal saline. If symptoms last >10 days, may be bacterial sinus infection.  Would need to be evaluated to see if antibiotics were indicated at that time.  Erasmo Downer, MD, MPH [facility] 05/02/2018 1:37 PM

## 2018-05-02 NOTE — Telephone Encounter (Signed)
FYI...  Pt calls back complaining of increasing shortness of breath, "not chest pain but chest tightness", she also states she has a "fever of 99.8" and it feels like she has a "lump in her throat."  She already has an appointment scheduled with Nadine Counts at 8 tomorrow morning.  I advised her with her symptoms worsening that she would need to be seen sooner at an Urgent Care or ER.  She refused and stated that she "doesn't have the money for that, and barely has the money to come back and see you tomorrow."  I told her I would not cancel her appointment but I do not recommend she waits until tomorrow to be seen.  She stated she understood what I was saying and will still wait until tomorrow.   Thanks,   Vernona Rieger

## 2018-05-02 NOTE — Telephone Encounter (Signed)
Patient advised as below. Patient refused to schedule appt for now. Patient reports that she was hoping to have an inhaler prescribed. I did let patient know that she has to be seen to be examined and make sure she needs and inhaler. sd

## 2018-05-02 NOTE — Telephone Encounter (Signed)
Pt called saying she is having a lot of sinus congestion, cough but she was just in last week.  She wants to know if you will send something to the pharmacy  She uses [facility]  Pt's CB [phone]

## 2018-05-03 ENCOUNTER — Ambulatory Visit: Payer: BLUE CROSS/BLUE SHIELD | Admitting: Family Medicine

## 2018-05-03 ENCOUNTER — Encounter: Payer: Self-pay | Admitting: Family Medicine

## 2018-05-03 VITALS — BP 110/80 | HR 83 | Temp 98.6°F | Resp 16 | Wt 226.8 lb

## 2018-05-03 DIAGNOSIS — J069 Acute upper respiratory infection, unspecified: Secondary | ICD-10-CM | POA: Diagnosis not present

## 2018-05-03 NOTE — Patient Instructions (Signed)
Discussed use of Mucinex D for congestion, Delsym for cough, and Benadryl for postnasal drainage Let me know if not improving Friday.

## 2018-05-03 NOTE — Progress Notes (Signed)
  Subjective:     Patient ID: Tallulah Birdwell Wyne, female   DOB: 1991-04-28, 28 y.o.   MRN: [id number] Chief Complaint  Patient presents with  . URI    Patient comes in office today with complaints of chest tightness that began 3 days ago. Patient reports the following symptoms; sore throat, ear fullness, cough productive of mucous, difficulty breathing, a feeling she describes as something "stuck in the throat", nausea and headache. Patient reports taking otc Aspirin for relief.    HPI Reports initial headache,sore throat with sinus congestion and low grade fevers. She has resumed smoking 3 cigarettes/day but has stopped since becoming sick. Reports that a family member is sick as well. Has been taking ASA for her sx.  Review of Systems     Objective:   Physical Exam  Constitutional: She appears well-developed and well-nourished. No distress.  Ears: T.M's intact without inflammation Throat: no tonsillar enlargement or exudate Neck: anterior cervical tenderness Lungs: clear     Assessment:    1. Viral upper respiratory tract infection    Plan:    Discussed use of Mucinex D for congestion, Delsym for cough, and Benadryl for postnasal drainage. Asked to to not use clonazepam and Benadryl together. May call at the end of the week if not improving.

## 2018-05-25 ENCOUNTER — Ambulatory Visit: Payer: BLUE CROSS/BLUE SHIELD | Admitting: Gastroenterology

## 2018-05-25 ENCOUNTER — Encounter: Payer: Self-pay | Admitting: Gastroenterology

## 2018-05-25 VITALS — BP 106/73 | HR 97 | Ht 61.0 in | Wt 220.2 lb

## 2018-05-25 DIAGNOSIS — R140 Abdominal distension (gaseous): Secondary | ICD-10-CM

## 2018-05-25 DIAGNOSIS — K219 Gastro-esophageal reflux disease without esophagitis: Secondary | ICD-10-CM

## 2018-05-25 DIAGNOSIS — K5900 Constipation, unspecified: Secondary | ICD-10-CM | POA: Diagnosis not present

## 2018-05-25 DIAGNOSIS — R109 Unspecified abdominal pain: Secondary | ICD-10-CM

## 2018-05-25 MED ORDER — POLYETHYLENE GLYCOL 3350 17 GM/SCOOP PO POWD: 17.0000 g | Freq: Every day | ORAL | 3 refills | Status: DC

## 2018-05-25 MED ORDER — OMEPRAZOLE 40 MG PO CPDR: 40.0000 mg | DELAYED_RELEASE_CAPSULE | Freq: Every day | ORAL | 3 refills | Status: DC

## 2018-05-25 NOTE — Progress Notes (Signed)
Wyline Mood MD, MRCP(U.K) [address]  [address]  [place], [place] [address]  Main: [phone]  Fax: [phone]   Gastroenterology Consultation  Referring Provider:     Erasmo Downer, MD Primary Care Physician:  Erasmo Downer, MD Primary Gastroenterologist:  Dr. Wyline Mood  Reason for Consultation:     GERD        HPI:   Nicole Wade is a 27 y.o. y/o female referred for consultation & management  by Dr. Beryle Flock, Marzella Schlein, MD.    She has been referred for reflux.    Reflux:  Onset : about 18 months - needs to watch what she eats, not progressed Symptoms: she says that she has burninhg sensation in her chest , acid "comes up ", really tight pains in her chest , right after she eats Recent weight gain: yes- 40 lbs ver 18 monhts and lost 10 lbs recently  Medications: Prilosec 20 mg , in the am on an empty stomach  Narcotics or anticholinergics use : no  PPI /H2 blockers or Antacid  use and timing :as above  Dinner time : 6.30 pm - bedtime at 10 pm - in between she is sitting upright   Prior EGD: no , on and off issues with swallowing last few months, more for the solids  Family history of esophageal cancer:no   Smoker : 7-8 cigarrettes since age of 36   She also has abdominal pain for weeks , central , all over the abdomen , on and off , each episode can last for hours. Dull in nature, no NSAID's, comes on after she eats . Usually 30 mins . Bowel movement helps relieve the pain. Has a bowel movement daily. Last few days has had harder stools.      Past Medical History:  Diagnosis Date  . ADHD   . Allergic rhinitis   . Anxiety   . Depression   . OCD (obsessive compulsive disorder)   . Panic disorder     Past Surgical History:  Procedure Laterality Date  . TOOTH EXTRACTION  2010   wisdom teeth    Prior to Admission medications   Medication Sig Start Date End Date Taking? Authorizing Provider  clonazePAM (KLONOPIN) 1 MG tablet  Take 0.5-1 tablets (0.5-1 mg total) by mouth 2 (two) times daily as needed for anxiety. 09/09/17   Erasmo Downer, MD  FLUoxetine (PROZAC) 10 MG capsule take 1 capsule by mouth once daily before meals 12/06/17   [provider]  lamoTRIgine (LAMICTAL) 200 MG tablet Take 200 mg by mouth at bedtime. 12/26/17   [provider]  MILI 0.25-35 MG-MCG tablet Take 1 tablet by mouth daily. Skip placebo pills, take continuously 04/19/18   Erasmo Downer, MD  omeprazole (PRILOSEC) 20 MG capsule Take 1 capsule (20 mg total) by mouth daily. 04/05/18   Bacigalupo, Marzella Schlein, MD  UNABLE TO FIND Med Name: L Methyldopate 15 mg    [provider]  VYVANSE 30 MG capsule  12/29/17   [provider]    Family History  Problem Relation Age of Onset  . Hypothyroidism Mother   . Anxiety disorder Mother   . Depression Mother   . Alcohol abuse Mother   . Depression Father   . Anxiety disorder Father   . Hypertension Father   . Anxiety disorder Brother   . Depression Brother   . Heart attack Maternal Grandfather 65  . CVA Paternal Grandfather 80  .  Heart attack Paternal Grandfather 50  . Diabetes Paternal Grandfather   . Breast cancer Paternal Aunt   . Colon cancer Neg Hx   . Cervical cancer Neg Hx      Social History   Tobacco Use  . Smoking status: Former Smoker    Years: 11.00    Last attempt to quit: 12/24/2017    Years since quitting: 0.4  . Smokeless tobacco: Former Neurosurgeon    Types: Snuff    Quit date: 03/23/2016  . Tobacco comment: started smoking at age 78  Substance Use Topics  . Alcohol use: No  . Drug use: Yes    Types: Marijuana    Allergies as of 05/25/2018 - Review Complete 05/03/2018  Allergen Reaction Noted  . Azithromycin Hives 05/17/2015  . Penicillins Hives 05/17/2015  . Sulfa antibiotics Hives 02/07/2017  . Prednisone Palpitations 01/07/2018    Review of Systems:    All systems reviewed and negative except where noted in HPI.    Physical Exam:  There were no vitals taken for this visit. No LMP recorded. (Menstrual status: Oral contraceptives). Psych:  Alert and cooperative. Normal mood and affect. General:   Alert,  Well-developed, well-nourished, pleasant and cooperative in NAD Head:  Normocephalic and atraumatic. Eyes:  Sclera clear, no icterus.   Conjunctiva pink. Ears:  Normal auditory acuity. Nose:  No deformity, discharge, or lesions. Mouth:  No deformity or lesions,oropharynx pink & moist. Neck:  Supple; no masses or thyromegaly. Lungs:  Respirations even and unlabored.  Clear throughout to auscultation.   No wheezes, crackles, or rhonchi. No acute distress. Heart:  Regular rate and rhythm; no murmurs, clicks, rubs, or gallops. Abdomen:  Normal bowel sounds.  No bruits.  Soft, non-tender and non-distended without masses, hepatosplenomegaly or hernias noted.  No guarding or rebound tenderness.    Msk:  Symmetrical without gross deformities. Good, equal movement & strength bilaterally. Pulses:  Normal pulses noted. Extremities:  No clubbing or edema.  No cyanosis. Neurologic:  Alert and oriented x3;  grossly normal neurologically. Skin:  Intact without significant lesions or rashes. No jaundice. Lymph Nodes:  No significant cervical adenopathy. Psych:  Alert and cooperative. Normal mood and affect.  Imaging Studies: No results found.  Assessment and Plan:   Nicole Wade is a 27 y.o. y/o female has been referred for GERD. She also has constipation and bloating which I think are contributing to her symptoms   GERD : Counseled on life style changes, suggest to use PPI first thing in the morning on empty stomach and eat 30 minutes after. Advised on the use of a wedge pillow at night , avoid meals for 2 hours prior to bed time. Weight loss  Provided GERD patient information.  Constipation : High fiber diet - trial of Miralax  At next visit if no better will discuss biliary work up , endoscopy and  imaging   Check labs and H pylori breath test .   Increase dose of Prilosec to 40 mg a day      Follow up in 3 weeks   Dr Wyline Mood MD,MRCP(U.K)

## 2018-05-25 NOTE — Patient Instructions (Signed)
High-Fiber Diet Fiber, also called dietary fiber, is a type of carbohydrate found in fruits, vegetables, whole grains, and beans. A high-fiber diet can have many health benefits. Your health care provider may recommend a high-fiber diet to help:  Prevent constipation. Fiber can make your bowel movements more regular.  Lower your cholesterol.  Relieve hemorrhoids, uncomplicated diverticulosis, or irritable bowel syndrome.  Prevent overeating as part of a weight-loss plan.  Prevent heart disease, type 2 diabetes, and certain cancers.  What is my plan? The recommended daily intake of fiber includes:  38 grams for men under age 50.  30 grams for men over age 50.  25 grams for women under age 50.  21 grams for women over age 50.  You can get the recommended daily intake of dietary fiber by eating a variety of fruits, vegetables, grains, and beans. Your health care provider may also recommend a fiber supplement if it is not possible to get enough fiber through your diet. What do I need to know about a high-fiber diet?  Fiber supplements have not been widely studied for their effectiveness, so it is better to get fiber through food sources.  Always check the fiber content on thenutrition facts label of any prepackaged food. Look for foods that contain at least 5 grams of fiber per serving.  Ask your dietitian if you have questions about specific foods that are related to your condition, especially if those foods are not listed in the following section.  Increase your daily fiber consumption gradually. Increasing your intake of dietary fiber too quickly may cause bloating, cramping, or gas.  Drink plenty of water. Water helps you to digest fiber. What foods can I eat? Grains Whole-grain breads. Multigrain cereal. Oats and oatmeal. Brown rice. Barley. Bulgur wheat. Millet. Bran muffins. Popcorn. Rye wafer crackers. Vegetables Sweet potatoes. Spinach. Kale. Artichokes. Cabbage.  Broccoli. Green peas. Carrots. Squash. Fruits Berries. Pears. Apples. Oranges. Avocados. Prunes and raisins. Dried figs. Meats and Other Protein Sources Navy, kidney, pinto, and soy beans. Split peas. Lentils. Nuts and seeds. Dairy Fiber-fortified yogurt. Beverages Fiber-fortified soy milk. Fiber-fortified orange juice. Other Fiber bars. The items listed above may not be a complete list of recommended foods or beverages. Contact your dietitian for more options. What foods are not recommended? Grains White bread. Pasta made with refined flour. White rice. Vegetables Fried potatoes. Canned vegetables. Well-cooked vegetables. Fruits Fruit juice. Cooked, strained fruit. Meats and Other Protein Sources Fatty cuts of meat. Fried poultry or fried fish. Dairy Milk. Yogurt. Cream cheese. Sour cream. Beverages Soft drinks. Other Cakes and pastries. Butter and oils. The items listed above may not be a complete list of foods and beverages to avoid. Contact your dietitian for more information. What are some tips for including high-fiber foods in my diet?  Eat a wide variety of high-fiber foods.  Make sure that half of all grains consumed each day are whole grains.  Replace breads and cereals made from refined flour or white flour with whole-grain breads and cereals.  Replace white rice with brown rice, bulgur wheat, or millet.  Start the day with a breakfast that is high in fiber, such as a cereal that contains at least 5 grams of fiber per serving.  Use beans in place of meat in soups, salads, or pasta.  Eat high-fiber snacks, such as berries, raw vegetables, nuts, or popcorn. This information is not intended to replace advice given to you by your health care provider. Make sure you discuss any   questions you have with your health care provider. Document Released: 08/10/2005 Document Revised: 01/16/2016 Document Reviewed: 01/23/2014 Elsevier Interactive Patient Education  2018  Elsevier Inc.  Gastroesophageal Reflux Disease, Adult Normally, food travels down the esophagus and stays in the stomach to be digested. If a person has gastroesophageal reflux disease (GERD), food and stomach acid move back up into the esophagus. When this happens, the esophagus becomes sore and swollen (inflamed). Over time, GERD can make small holes (ulcers) in the lining of the esophagus. Follow these instructions at home: Diet  Follow a diet as told by your doctor. You may need to avoid foods and drinks such as: ? Coffee and tea (with or without caffeine). ? Drinks that contain alcohol. ? Energy drinks and sports drinks. ? Carbonated drinks or sodas. ? Chocolate and cocoa. ? Peppermint and mint flavorings. ? Garlic and onions. ? Horseradish. ? Spicy and acidic foods, such as peppers, chili powder, curry powder, vinegar, hot sauces, and BBQ sauce. ? Citrus fruit juices and citrus fruits, such as oranges, lemons, and limes. ? Tomato-based foods, such as red sauce, chili, salsa, and pizza with red sauce. ? Fried and fatty foods, such as donuts, french fries, potato chips, and high-fat dressings. ? High-fat meats, such as hot dogs, rib eye steak, sausage, ham, and bacon. ? High-fat dairy items, such as whole milk, butter, and cream cheese.  Eat small meals often. Avoid eating large meals.  Avoid drinking large amounts of liquid with your meals.  Avoid eating meals during the 2-3 hours before bedtime.  Avoid lying down right after you eat.  Do not exercise right after you eat. General instructions  Pay attention to any changes in your symptoms.  Take over-the-counter and prescription medicines only as told by your doctor. Do not take aspirin, ibuprofen, or other NSAIDs unless your doctor says it is okay.  Do not use any tobacco products, including cigarettes, chewing tobacco, and e-cigarettes. If you need help quitting, ask your doctor.  Wear loose clothes. Do not wear  anything tight around your waist.  Raise (elevate) the head of your bed about 6 inches (15 cm).  Try to lower your stress. If you need help doing this, ask your doctor.  If you are overweight, lose an amount of weight that is healthy for you. Ask your doctor about a safe weight loss goal.  Keep all follow-up visits as told by your doctor. This is important. Contact a doctor if:  You have new symptoms.  You lose weight and you do not know why it is happening.  You have trouble swallowing, or it hurts to swallow.  You have wheezing or a cough that keeps happening.  Your symptoms do not get better with treatment.  You have a hoarse voice. Get help right away if:  You have pain in your arms, neck, jaw, teeth, or back.  You feel sweaty, dizzy, or light-headed.  You have chest pain or shortness of breath.  You throw up (vomit) and your throw up looks like blood or coffee grounds.  You pass out (faint).  Your poop (stool) is bloody or black.  You cannot swallow, drink, or eat. This information is not intended to replace advice given to you by your health care provider. Make sure you discuss any questions you have with your health care provider. Document Released: 01/27/2008 Document Revised: 01/16/2016 Document Reviewed: 12/05/2014 Elsevier Interactive Patient Education  2018 Elsevier Inc.  

## 2018-05-26 ENCOUNTER — Encounter: Payer: Self-pay | Admitting: Gastroenterology

## 2018-05-26 LAB — COMPREHENSIVE METABOLIC PANEL
ALBUMIN: 4.5 g/dL (ref 3.5–5.5)
ALK PHOS: 53 IU/L (ref 39–117)
ALT: 11 IU/L (ref 0–32)
AST: 9 IU/L (ref 0–40)
Albumin/Globulin Ratio: 2 (ref 1.2–2.2)
BUN / CREAT RATIO: 9 (ref 9–23)
BUN: 8 mg/dL (ref 6–20)
Bilirubin Total: 0.2 mg/dL (ref 0.0–1.2)
CO2: 22 mmol/L (ref 20–29)
CREATININE: 0.94 mg/dL (ref 0.57–1.00)
Calcium: 9.8 mg/dL (ref 8.7–10.2)
Chloride: 103 mmol/L (ref 96–106)
GFR calc non Af Amer: 83 mL/min/{1.73_m2} (ref >59)
GFR, EST AFRICAN AMERICAN: 95 mL/min/{1.73_m2} (ref >59)
GLUCOSE: 100 mg/dL — AB (ref 65–99)
Globulin, Total: 2.2 g/dL (ref 1.5–4.5)
Potassium: 4.7 mmol/L (ref 3.5–5.2)
Sodium: 138 mmol/L (ref 134–144)
TOTAL PROTEIN: 6.7 g/dL (ref 6.0–8.5)

## 2018-05-26 LAB — CBC WITH DIFFERENTIAL/PLATELET
Basophils Absolute: 0 10*3/uL (ref 0.0–0.2)
Basos: 1 %
EOS (ABSOLUTE): 0.1 10*3/uL (ref 0.0–0.4)
Eos: 2 %
Hematocrit: 40.6 % (ref 34.0–46.6)
Hemoglobin: 13.5 g/dL (ref 11.1–15.9)
IMMATURE GRANS (ABS): 0 10*3/uL (ref 0.0–0.1)
IMMATURE GRANULOCYTES: 0 %
LYMPHS: 49 %
Lymphocytes Absolute: 2.7 10*3/uL (ref 0.7–3.1)
MCH: 29 pg (ref 26.6–33.0)
MCHC: 33.3 g/dL (ref 31.5–35.7)
MCV: 87 fL (ref 79–97)
MONOCYTES: 6 %
Monocytes Absolute: 0.3 10*3/uL (ref 0.1–0.9)
NEUTROS PCT: 42 %
Neutrophils Absolute: 2.3 10*3/uL (ref 1.4–7.0)
Platelets: 280 10*3/uL (ref 150–450)
RBC: 4.65 x10E6/uL (ref 3.77–5.28)
RDW: 12.2 % — ABNORMAL LOW (ref 12.3–15.4)
WBC: 5.5 10*3/uL (ref 3.4–10.8)

## 2018-05-26 LAB — TSH: TSH: 1.33 u[IU]/mL (ref 0.450–4.500)

## 2018-05-27 LAB — H. PYLORI BREATH TEST: H PYLORI BREATH TEST: NEGATIVE

## 2018-06-22 ENCOUNTER — Ambulatory Visit: Payer: BLUE CROSS/BLUE SHIELD | Admitting: Gastroenterology

## 2018-06-22 ENCOUNTER — Encounter: Payer: Self-pay | Admitting: Gastroenterology

## 2018-06-22 VITALS — BP 117/80 | HR 86 | Ht 61.0 in | Wt 221.6 lb

## 2018-06-22 DIAGNOSIS — K5900 Constipation, unspecified: Secondary | ICD-10-CM | POA: Diagnosis not present

## 2018-06-22 DIAGNOSIS — K219 Gastro-esophageal reflux disease without esophagitis: Secondary | ICD-10-CM

## 2018-06-22 NOTE — Progress Notes (Signed)
Wyline Mood MD, MRCP(U.K) [address]  [address]  [place], [place] [address]  Main: [phone]  Fax: [phone]   Primary Care Physician: Erasmo Downer, MD  Primary Gastroenterologist:  Dr. Wyline Mood   No chief complaint on file.   HPI: Nicole Wade is a 26 y.o. female    Summary of history :  She was initially referred and seen on 05/25/18 for GERD which has been ongoing for 18 months. Symptoms of regurgitation of acid, chest pains, gained 40 lbs last 1.5 years . Some issues with swallowing . Smoker. Diffuse generalized abdominal pain with relief after a bowel movement , has hard stools.   Interval history   05/25/2018-  06/22/2018  05/25/18: H pylori breath test negative . CBC, CMP and TSH normal   On prilosec in the am before breakfast- not helping her heartburn. Still having burning and pain. -worse at night when she lays flat.   Tried mirlax daily - did not work well after a week. Needs something stronger.    Current Outpatient Medications  Medication Sig Dispense Refill  . clonazePAM (KLONOPIN) 1 MG tablet Take 0.5-1 tablets (0.5-1 mg total) by mouth 2 (two) times daily as needed for anxiety. 45 tablet 2  . FLUoxetine (PROZAC) 10 MG capsule take 1 capsule by mouth once daily before meals  0  . lamoTRIgine (LAMICTAL) 200 MG tablet Take 200 mg by mouth at bedtime.  0  . MILI 0.25-35 MG-MCG tablet Take 1 tablet by mouth daily. Skip placebo pills, take continuously 4 Package 3  . omeprazole (PRILOSEC) 40 MG capsule Take 1 capsule (40 mg total) by mouth daily. 90 capsule 3  . polyethylene glycol powder (GLYCOLAX/MIRALAX) powder Take 17 g by mouth daily. 255 g 3  . UNABLE TO FIND Med Name: L Methyldopate 15 mg    . VYVANSE 30 MG capsule   0   No current facility-administered medications for this visit.     Allergies as of 06/22/2018 - Review Complete 05/25/2018  Allergen Reaction Noted  . Azithromycin Hives 05/17/2015  . Penicillins Hives  05/17/2015  . Sulfa antibiotics Hives 02/07/2017  . Prednisone Palpitations 01/07/2018    ROS:  General: Negative for anorexia, weight loss, fever, chills, fatigue, weakness. ENT: Negative for hoarseness, difficulty swallowing , nasal congestion. CV: Negative for chest pain, angina, palpitations, dyspnea on exertion, peripheral edema.  Respiratory: Negative for dyspnea at rest, dyspnea on exertion, cough, sputum, wheezing.  GI: See history of present illness. GU:  Negative for dysuria, hematuria, urinary incontinence, urinary frequency, nocturnal urination.  Endo: Negative for unusual weight change.    Physical Examination:   There were no vitals taken for this visit.  General: Well-nourished, well-developed in no acute distress.  Eyes: No icterus. Conjunctivae pink. Mouth: Oropharyngeal mucosa moist and pink , no lesions erythema or exudate. Lungs: Clear to auscultation bilaterally. Non-labored. Heart: Regular rate and rhythm, no murmurs rubs or gallops.  Abdomen: Bowel sounds are normal, nontender, nondistended, no hepatosplenomegaly or masses, no abdominal bruits or hernia , no rebound or guarding.   Extremities: No lower extremity edema. No clubbing or deformities. Neuro: Alert and oriented x 3.  Grossly intact. Skin: Warm and dry, no jaundice.   Psych: Alert and cooperative, normal mood and affect.  BP 117/80   Pulse 86   Ht 5\' 1"  (1.549 m)   Wt 221 lb 9.6 oz (100.5 kg)   BMI 41.87 kg/m   Imaging Studies: No results found.  Assessment  and Plan:   Nicole Wade is a 27 y.o. y/o female here to follow up  for GERD. She also has constipation and bloating which I think are contributing to her symptoms . Prilosec didn't work, mirlax didn't work    1. Change from Prilosec to Dexilant- give 2 weeks samples  2. Stop miralax and commence on linzess 145 mcg - 2 week samples provided   3. At next visit if no better will discuss biliary work up , endoscopy and  imaging     Dr Wyline Mood  MD,MRCP [facility]) Follow up in 4 weeks

## 2018-06-27 ENCOUNTER — Other Ambulatory Visit: Payer: Self-pay

## 2018-06-27 ENCOUNTER — Encounter: Payer: Self-pay | Admitting: Emergency Medicine

## 2018-06-27 ENCOUNTER — Emergency Department
Admission: EM | Admit: 2018-06-27 | Discharge: 2018-06-27 | Disposition: A | Discharge status: Home / Self Care | Payer: BLUE CROSS/BLUE SHIELD | Attending: Emergency Medicine | Admitting: Emergency Medicine

## 2018-06-27 ENCOUNTER — Telehealth: Payer: Self-pay | Admitting: Emergency Medicine

## 2018-06-27 DIAGNOSIS — R109 Unspecified abdominal pain: Secondary | ICD-10-CM | POA: Insufficient documentation

## 2018-06-27 DIAGNOSIS — Z5321 Procedure and treatment not carried out due to patient leaving prior to being seen by health care provider: Secondary | ICD-10-CM | POA: Insufficient documentation

## 2018-06-27 LAB — URINALYSIS, COMPLETE (UACMP) WITH MICROSCOPIC
BILIRUBIN URINE: NEGATIVE
GLUCOSE, UA: NEGATIVE mg/dL
KETONES UR: NEGATIVE mg/dL
LEUKOCYTES UA: NEGATIVE
NITRITE: NEGATIVE
PH: 6 (ref 5.0–8.0)
Protein, ur: NEGATIVE mg/dL
SPECIFIC GRAVITY, URINE: 1.004 — AB (ref 1.005–1.030)

## 2018-06-27 LAB — CBC
HEMATOCRIT: 40.3 % (ref 36.0–46.0)
Hemoglobin: 13.6 g/dL (ref 12.0–15.0)
MCH: 29 pg (ref 26.0–34.0)
MCHC: 33.7 g/dL (ref 30.0–36.0)
MCV: 85.9 fL (ref 80.0–100.0)
NRBC: 0 % (ref 0.0–0.2)
PLATELETS: 308 10*3/uL (ref 150–400)
RBC: 4.69 MIL/uL (ref 3.87–5.11)
RDW: 12.1 % (ref 11.5–15.5)
WBC: 8.7 10*3/uL (ref 4.0–10.5)

## 2018-06-27 LAB — TROPONIN I: Troponin I: <0.03 ng/mL (ref <0.03)

## 2018-06-27 LAB — COMPREHENSIVE METABOLIC PANEL
ALT: 13 U/L (ref 0–44)
AST: 14 U/L — ABNORMAL LOW (ref 15–41)
Albumin: 4.2 g/dL (ref 3.5–5.0)
Alkaline Phosphatase: 56 U/L (ref 38–126)
Anion gap: 8 (ref 5–15)
BILIRUBIN TOTAL: 0.3 mg/dL (ref 0.3–1.2)
BUN: 15 mg/dL (ref 6–20)
CALCIUM: 9 mg/dL (ref 8.9–10.3)
CHLORIDE: 106 mmol/L (ref 98–111)
CO2: 23 mmol/L (ref 22–32)
CREATININE: 0.87 mg/dL (ref 0.44–1.00)
Glucose, Bld: 104 mg/dL — ABNORMAL HIGH (ref 70–99)
Potassium: 3.8 mmol/L (ref 3.5–5.1)
Sodium: 137 mmol/L (ref 135–145)
TOTAL PROTEIN: 7.3 g/dL (ref 6.5–8.1)

## 2018-06-27 LAB — POCT PREGNANCY, URINE: Preg Test, Ur: NEGATIVE

## 2018-06-27 LAB — LIPASE, BLOOD: LIPASE: 41 U/L (ref 11–51)

## 2018-06-27 NOTE — ED Notes (Signed)
Patient up to desk inquiring about wait time, updated patient.

## 2018-06-27 NOTE — ED Triage Notes (Addendum)
Pt reports waking about 1 hour ago with pain across the middle of her abd; says she was feeling around her abd and felt a "lump" on the left side and has marked that for [place] with a black marker; pt reports nausea but no vomiting; no history of pain like this; pt tried no medication at home for pain before coming to ED; pt awake and alert; talking in complete coherent sentences; pt says she feels hot then cold; she gets the abd spasms and then feels like her heart is "beating harder";

## 2018-06-27 NOTE — Telephone Encounter (Signed)
Called patient due to lwot to inquire about condition and follow up plans.  No answer and no voicemail  

## 2018-06-27 NOTE — ED Notes (Signed)
No answer when called for vital signs 

## 2018-06-27 NOTE — ED Notes (Signed)
Unable to locate patient to give update. 

## 2018-07-01 ENCOUNTER — Encounter: Payer: Self-pay | Admitting: Family Medicine

## 2018-07-01 ENCOUNTER — Ambulatory Visit: Payer: BLUE CROSS/BLUE SHIELD | Admitting: Family Medicine

## 2018-07-01 VITALS — BP 117/83 | HR 98 | Temp 98.7°F | Wt 220.6 lb

## 2018-07-01 DIAGNOSIS — R1012 Left upper quadrant pain: Secondary | ICD-10-CM

## 2018-07-01 DIAGNOSIS — K5904 Chronic idiopathic constipation: Secondary | ICD-10-CM | POA: Diagnosis not present

## 2018-07-01 MED ORDER — DICYCLOMINE HCL 10 MG PO CAPS: 10.0000 mg | ORAL_CAPSULE | Freq: Three times a day (TID) | ORAL | 0 refills | Status: DC

## 2018-07-01 NOTE — Progress Notes (Signed)
Patient: Nicole Wade Female    DOB: 1990/10/17   27 y.o.   MRN: [id number] Visit Date: 07/01/2018  Today's Provider: Shirlee Latch, MD   Chief Complaint  Patient presents with  . Abdominal Pain   Subjective:    I, Presley Raddle, CMA, am acting as a scribe for Shirlee Latch, MD.   Abdominal Pain  This is a new problem. Episode onset: Sunday. The onset quality is sudden. The problem occurs constantly. The problem has been unchanged. The pain is located in the LUQ. The quality of the pain is sharp and dull. Associated symptoms include constipation, diarrhea and flatus. The pain is aggravated by certain positions. She has tried acetaminophen (mint tea, ginger ale) for the symptoms. The treatment provided mild relief.   She is taking Linzess and having some improvement in constipation.  Has f/u with GI in 2 weeks.    Allergies  Allergen Reactions  . Azithromycin Hives  . Penicillins Hives  . Sulfa Antibiotics Hives  . Prednisone Palpitations    Caused mental distress     Current Outpatient Medications:  .  clonazePAM (KLONOPIN) 1 MG tablet, Take 0.5-1 tablets (0.5-1 mg total) by mouth 2 (two) times daily as needed for anxiety., Disp: 45 tablet, Rfl: 2 .  lamoTRIgine (LAMICTAL) 200 MG tablet, Take 200 mg by mouth at bedtime., Disp: , Rfl: 0 .  MILI 0.25-35 MG-MCG tablet, Take 1 tablet by mouth daily. Skip placebo pills, take continuously, Disp: 4 Package, Rfl: 3 .  omeprazole (PRILOSEC) 40 MG capsule, Take 1 capsule (40 mg total) by mouth daily., Disp: 90 capsule, Rfl: 3 .  polyethylene glycol powder (GLYCOLAX/MIRALAX) powder, Take 17 g by mouth daily., Disp: 255 g, Rfl: 3 .  UNABLE TO FIND, Med Name: L Methyldopate 15 mg, Disp: , Rfl:  .  VYVANSE 30 MG capsule, , Disp: , Rfl: 0  Review of Systems  Constitutional: Negative.   Respiratory: Negative.   Cardiovascular: Negative.   Gastrointestinal: Positive for abdominal pain, constipation, diarrhea and  flatus.  Musculoskeletal: Negative.     Social History   Tobacco Use  . Smoking status: Current Every Day Smoker    Years: 11.00  . Smokeless tobacco: Former Neurosurgeon    Types: Snuff    Quit date: 03/23/2016  . Tobacco comment: started smoking at age 61  Substance Use Topics  . Alcohol use: No   Objective:   BP 117/83 (BP Location: Right Arm, Patient Position: Sitting, Cuff Size: Large)   Pulse 98   Temp 98.7 F (37.1 C) (Oral)   Wt 220 lb 9.6 oz (100.1 kg)   SpO2 99%   BMI 41.68 kg/m  Vitals:   07/01/18 0813  BP: 117/83  Pulse: 98  Temp: 98.7 F (37.1 C)  TempSrc: Oral  SpO2: 99%  Weight: 220 lb 9.6 oz (100.1 kg)     Physical Exam  Constitutional: She is oriented to person, place, and time. She appears well-developed and well-nourished. No distress.  HENT:  Head: Normocephalic and atraumatic.  Mouth/Throat: Oropharynx is clear and moist.  Eyes: Pupils are equal, round, and reactive to light. No scleral icterus.  Cardiovascular: Normal rate, regular rhythm, normal heart sounds and intact distal pulses.  No murmur heard. Pulmonary/Chest: Effort normal and breath sounds normal. No respiratory distress. She has no wheezes. She has no rales.  Abdominal: Soft. Normal appearance and bowel sounds are normal. She exhibits no distension and no ascites. There is no hepatosplenomegaly.  There is generalized tenderness. There is no rigidity, no rebound, no guarding and no CVA tenderness.  Neurological: She is alert and oriented to person, place, and time.  Skin: Skin is warm and dry. Capillary refill takes less than 2 seconds. No rash noted.  Psychiatric: She has a normal mood and affect. Her behavior is normal.  Vitals reviewed.       Assessment & Plan:   1. LUQ abdominal pain 2. Chronic idiopathic constipation - continue Linzess - keep f/u with GI - generalized tenderness - seems to be having colon spasms - will try Bentyl - recent normal labs in ED - return  precautions discussed    Meds ordered this encounter  Medications  . dicyclomine (BENTYL) 10 MG capsule    Sig: Take 1 capsule (10 mg total) by mouth 4 (four) times daily -  before meals and at bedtime.    Dispense:  120 capsule    Refill:  0     Return if symptoms worsen or fail to improve.   The entirety of the information documented in the History of Present Illness, Review of Systems and Physical Exam were personally obtained by me. Portions of this information were initially documented by Presley Raddle, CMA and reviewed by me for thoroughness and accuracy.    Erasmo Downer, MD, MPH [facility] 07/01/2018 8:51 AM

## 2018-07-01 NOTE — Patient Instructions (Signed)
Irritable Bowel Syndrome, Adult Irritable bowel syndrome (IBS) is not one specific disease. It is a group of symptoms that affects the organs responsible for digestion (gastrointestinal or GI tract). To regulate how your GI tract works, your body sends signals back and forth between your intestines and your brain. If you have IBS, there may be a problem with these signals. As a result, your GI tract does not function normally. Your intestines may become more sensitive and overreact to certain things. This is especially true when you eat certain foods or when you are under stress. There are four types of IBS. These may be determined based on the consistency of your stool:  IBS with diarrhea.  IBS with constipation.  Mixed IBS.  Unsubtyped IBS.  It is important to know which type of IBS you have. Some treatments are more likely to be helpful for certain types of IBS. What are the causes? The exact cause of IBS is not known. What increases the risk? You may have a higher risk of IBS if:  You are a woman.  You are younger than 28 years old.  You have a family history of IBS.  You have mental health problems.  You have had bacterial infection of your GI tract.  What are the signs or symptoms? Symptoms of IBS vary from person to person. The main symptom is abdominal pain or discomfort. Additional symptoms usually include one or more of the following:  Diarrhea, constipation, or both.  Abdominal swelling or bloating.  Feeling full or sick after eating a small or regular-size meal.  Frequent gas.  Mucus in the stool.  A feeling of having more stool left after a bowel movement.  Symptoms tend to come and go. They may be associated with stress, psychiatric conditions, or nothing at all. How is this diagnosed? There is no specific test to diagnose IBS. Your health care provider will make a diagnosis based on a physical exam, medical history, and your symptoms. You may have other  tests to rule out other conditions that may be causing your symptoms. These may include:  Blood tests.  X-rays.  CT scan.  Endoscopy and colonoscopy. This is a test in which your GI tract is viewed with a long, thin, flexible tube.  How is this treated? There is no cure for IBS, but treatment can help relieve symptoms. IBS treatment often includes:  Changes to your diet, such as: ? Eating more fiber. ? Avoiding foods that cause symptoms. ? Drinking more water. ? Eating regular, medium-sized portioned meals.  Medicines. These may include: ? Fiber supplements if you have constipation. ? Medicine to control diarrhea (antidiarrheal medicines). ? Medicine to help control muscle spasms in your GI tract (antispasmodic medicines). ? Medicines to help with any mental health issues, such as antidepressants or tranquilizers.  Therapy. ? Talk therapy may help with anxiety, depression, or other mental health issues that can make IBS symptoms worse.  Stress reduction. ? Managing your stress can help keep symptoms under control.  Follow these instructions at home:  Take medicines only as directed by your health care provider.  Eat a healthy diet. ? Avoid foods and drinks with added sugar. ? Include more whole grains, fruits, and vegetables gradually into your diet. This may be especially helpful if you have IBS with constipation. ? Avoid any foods and drinks that make your symptoms worse. These may include dairy products and caffeinated or carbonated drinks. ? Do not eat large meals. ? Drink enough   fluid to keep your urine clear or pale yellow.  Exercise regularly. Ask your health care provider for recommendations of good activities for you.  Keep all follow-up visits as directed by your health care provider. This is important. Contact a health care provider if:  You have constant pain.  You have trouble or pain with swallowing.  You have worsening diarrhea. Get help right away  if:  You have severe and worsening abdominal pain.  You have diarrhea and: ? You have a rash, stiff neck, or severe headache. ? You are irritable, sleepy, or difficult to awaken. ? You are weak, dizzy, or extremely thirsty.  You have bright red blood in your stool or you have black tarry stools.  You have unusual abdominal swelling that is painful.  You vomit continuously.  You vomit blood (hematemesis).  You have both abdominal pain and a fever. This information is not intended to replace advice given to you by your health care provider. Make sure you discuss any questions you have with your health care provider. Document Released: 08/10/2005 Document Revised: 01/10/2016 Document Reviewed: 04/27/2014 Elsevier Interactive Patient Education  2018 Elsevier Inc.  

## 2018-07-05 ENCOUNTER — Telehealth: Payer: Self-pay | Admitting: Family Medicine

## 2018-07-05 DIAGNOSIS — Z789 Other specified health status: Secondary | ICD-10-CM

## 2018-07-05 DIAGNOSIS — Z0184 Encounter for antibody response examination: Secondary | ICD-10-CM

## 2018-07-05 DIAGNOSIS — Z111 Encounter for screening for respiratory tuberculosis: Secondary | ICD-10-CM

## 2018-07-05 NOTE — Telephone Encounter (Signed)
Pt is starting Nursing School at [employer]  She is needing the following faxed:  Immunization Records Hep B Vaccine MMR Vaccine Tetanus Shot TB Test results CPE  Please fax the above to: Att: Idamae Schuller - Fax [phone]   Thanks, [place]

## 2018-07-06 NOTE — Telephone Encounter (Signed)
So she would need to get titers of Hep B and MMR, as well as quant gold for TB screening to complete these requirements as we have no vaccine records except Tdap.  I can order these labs if she wants to come in to have these drawn.   She would also need to sign ROI to allow [place] to fax this info there  Virginia Crews, MD, MPH [facility] 07/06/2018 10:33 AM

## 2018-07-06 NOTE — Telephone Encounter (Signed)
Labs ordered  Erasmo DownerBacigalupo,  M, MD, MPH [facility] 07/06/2018 11:55 AM

## 2018-07-06 NOTE — Telephone Encounter (Signed)
Patient advised. She will stop by tomorrow morning to sign ROI. She wanted to try to find her vaccine records, but requested the order be put in now in case she is unable to find the records.

## 2018-07-11 ENCOUNTER — Telehealth: Payer: Self-pay | Admitting: Gastroenterology

## 2018-07-11 ENCOUNTER — Other Ambulatory Visit: Payer: Self-pay

## 2018-07-11 NOTE — Telephone Encounter (Signed)
Patient states samples of medication dexilant are working [facility]

## 2018-07-15 MED ORDER — DEXLANSOPRAZOLE 60 MG PO CPDR: 60.0000 mg | DELAYED_RELEASE_CAPSULE | Freq: Every day | ORAL | 1 refills | Status: DC

## 2018-07-15 NOTE — Telephone Encounter (Signed)
Spoke with pt and informed her that the prescription for Dexilant will be sent to her preferred pharmacy.

## 2018-07-27 ENCOUNTER — Ambulatory Visit: Payer: BLUE CROSS/BLUE SHIELD | Admitting: Gastroenterology

## 2018-07-27 ENCOUNTER — Encounter: Payer: Self-pay | Admitting: Gastroenterology

## 2018-07-27 ENCOUNTER — Other Ambulatory Visit: Payer: Self-pay

## 2018-07-27 VITALS — BP 106/71 | HR 100 | Ht 61.0 in | Wt 216.2 lb

## 2018-07-27 DIAGNOSIS — K5900 Constipation, unspecified: Secondary | ICD-10-CM

## 2018-07-27 DIAGNOSIS — R140 Abdominal distension (gaseous): Secondary | ICD-10-CM | POA: Diagnosis not present

## 2018-07-27 DIAGNOSIS — K219 Gastro-esophageal reflux disease without esophagitis: Secondary | ICD-10-CM | POA: Diagnosis not present

## 2018-07-27 NOTE — Progress Notes (Signed)
Wyline MoodKiran Matias Thurman MD, MRCP(U.K) [address]  [address]  [place], [place] [address]  Main: [phone]  Fax: [phone]   Primary Care Physician: Erasmo DownerBacigalupo, Angela M, MD  Primary Gastroenterologist:  Dr. Wyline MoodKiran Malori Myers   Chief Complaint  Patient presents with  . Follow-up    GERD    HPI: Nicole Wade is a 27 y.o. female   Summary of history :  She was initially referred and seen on 05/25/18 for GERD which has been ongoing for 18 months. Symptoms of regurgitation of acid, chest pains, gained 40 lbs last 1.5 years . Some issues with swallowing . Smoker. Diffuse generalized abdominal pain with relief after a bowel movement , has hard stools.  05/25/18: H pylori breath test negative . CBC, CMP and TSH normal    Interval history 06/22/2018-07/27/18   Prilosec didn't work but Air cabin crewdexilant did work. Linzess worked well, no abdominal pain still has bloating. No sodas, no chewing gum, no hard candy . No other issues with bowel movement.. Long standing issues with constipation for many years.  She says that she had some abdominal pain in 06/2018 and had to see her doctor , describes the pain as a discomfort. , no clear agrevating factors, not related to intake of food.    Current Outpatient Medications  Medication Sig Dispense Refill  . clonazePAM (KLONOPIN) 1 MG tablet Take 0.5-1 tablets (0.5-1 mg total) by mouth 2 (two) times daily as needed for anxiety. 45 tablet 2  . dexlansoprazole (DEXILANT) 60 MG capsule Take 1 capsule (60 mg total) by mouth daily. 30 capsule 1  . dicyclomine (BENTYL) 10 MG capsule Take 1 capsule (10 mg total) by mouth 4 (four) times daily -  before meals and at bedtime. 120 capsule 0  . lamoTRIgine (LAMICTAL) 200 MG tablet Take 200 mg by mouth at bedtime.  0  . MILI 0.25-35 MG-MCG tablet Take 1 tablet by mouth daily. Skip placebo pills, take continuously 4 Package 3  . omeprazole (PRILOSEC) 40 MG capsule Take 1 capsule (40 mg total) by mouth daily. 90  capsule 3  . polyethylene glycol powder (GLYCOLAX/MIRALAX) powder Take 17 g by mouth daily. 255 g 3  . UNABLE TO FIND Med Name: L Methyldopate 15 mg    . VYVANSE 30 MG capsule   0   No current facility-administered medications for this visit.     Allergies as of 07/27/2018 - Review Complete 07/27/2018  Allergen Reaction Noted  . Azithromycin Hives 05/17/2015  . Penicillins Hives 05/17/2015  . Sulfa antibiotics Hives 02/07/2017  . Prednisone Palpitations 01/07/2018    ROS:  General: Negative for anorexia, weight loss, fever, chills, fatigue, weakness. ENT: Negative for hoarseness, difficulty swallowing , nasal congestion. CV: Negative for chest pain, angina, palpitations, dyspnea on exertion, peripheral edema.  Respiratory: Negative for dyspnea at rest, dyspnea on exertion, cough, sputum, wheezing.  GI: See history of present illness. GU:  Negative for dysuria, hematuria, urinary incontinence, urinary frequency, nocturnal urination.  Endo: Negative for unusual weight change.    Physical Examination:   BP 106/71   Pulse 100   Ht 5\' 1"  (1.549 m)   Wt 216 lb 3.2 oz (98.1 kg)   BMI 40.85 kg/m   General: Well-nourished, well-developed in no acute distress.  Eyes: No icterus. Conjunctivae pink. Mouth: Oropharyngeal mucosa moist and pink , no lesions erythema or exudate. Lungs: Clear to auscultation bilaterally. Non-labored. Heart: Regular rate and rhythm, no murmurs rubs or gallops.  Abdomen: Bowel sounds are  normal, nontender, nondistended, no hepatosplenomegaly or masses, no abdominal bruits or hernia , no rebound or guarding.   Extremities: No lower extremity edema. No clubbing or deformities. Neuro: Alert and oriented x 3.  Grossly intact. Skin: Warm and dry, no jaundice.   Psych: Alert and cooperative, normal mood and affect.   Imaging Studies: No results found.  Assessment and Plan:   Nicole Wade is a 27 y.o. y/o female here to follow up  for abdominal  pain which I feel is a combination of GERD and constipation.   Plan   1. Continue Dexilant   2. Continue on linzess 145 mcgd   3. Proceed with EGD and if negative , and still having symptoms then consider  CT scan of the abdomen due to episodic persistence of the abdominal   I have discussed alternative options, risks & benefits,  which include, but are not limited to, bleeding, infection, perforation,respiratory complication & drug reaction.  The patient agrees with this plan & written consent will be obtained.       Dr Wyline Mood  MD,MRCP [facility]) Follow up in 6 weeks

## 2018-08-04 ENCOUNTER — Ambulatory Visit
Admission: RE | Admit: 2018-08-04 | Discharge: 2018-08-04 | Disposition: A | Discharge status: Home / Self Care | Payer: BLUE CROSS/BLUE SHIELD | Attending: Gastroenterology | Admitting: Gastroenterology

## 2018-08-04 ENCOUNTER — Encounter: Payer: Self-pay | Admitting: *Deleted

## 2018-08-04 ENCOUNTER — Encounter
Admission: RE | Disposition: A | Discharge status: Home / Self Care | Payer: Self-pay | Source: Home / Self Care | Attending: Gastroenterology

## 2018-08-04 ENCOUNTER — Ambulatory Visit: Payer: BLUE CROSS/BLUE SHIELD | Admitting: Anesthesiology

## 2018-08-04 DIAGNOSIS — K5900 Constipation, unspecified: Secondary | ICD-10-CM

## 2018-08-04 DIAGNOSIS — K219 Gastro-esophageal reflux disease without esophagitis: Secondary | ICD-10-CM | POA: Insufficient documentation

## 2018-08-04 DIAGNOSIS — R140 Abdominal distension (gaseous): Secondary | ICD-10-CM

## 2018-08-04 DIAGNOSIS — F909 Attention-deficit hyperactivity disorder, unspecified type: Secondary | ICD-10-CM | POA: Insufficient documentation

## 2018-08-04 DIAGNOSIS — F429 Obsessive-compulsive disorder, unspecified: Secondary | ICD-10-CM | POA: Insufficient documentation

## 2018-08-04 DIAGNOSIS — F419 Anxiety disorder, unspecified: Secondary | ICD-10-CM | POA: Insufficient documentation

## 2018-08-04 DIAGNOSIS — Z79899 Other long term (current) drug therapy: Secondary | ICD-10-CM | POA: Insufficient documentation

## 2018-08-04 DIAGNOSIS — F17200 Nicotine dependence, unspecified, uncomplicated: Secondary | ICD-10-CM | POA: Insufficient documentation

## 2018-08-04 HISTORY — PX: ESOPHAGOGASTRODUODENOSCOPY (EGD) WITH PROPOFOL: SHX5813

## 2018-08-04 LAB — URINE DRUG SCREEN, QUALITATIVE (ARMC ONLY)
AMPHETAMINES, UR SCREEN: NOT DETECTED
BARBITURATES, UR SCREEN: NOT DETECTED
Benzodiazepine, Ur Scrn: POSITIVE — AB
CANNABINOID 50 NG, UR ~~LOC~~: NOT DETECTED
Cocaine Metabolite,Ur ~~LOC~~: NOT DETECTED
MDMA (Ecstasy)Ur Screen: NOT DETECTED
Methadone Scn, Ur: NOT DETECTED
OPIATE, UR SCREEN: NOT DETECTED
Phencyclidine (PCP) Ur S: NOT DETECTED
TRICYCLIC, UR SCREEN: NOT DETECTED

## 2018-08-04 LAB — POCT PREGNANCY, URINE: Preg Test, Ur: NEGATIVE

## 2018-08-04 SURGERY — ESOPHAGOGASTRODUODENOSCOPY (EGD) WITH PROPOFOL
Anesthesia: General

## 2018-08-04 MED ORDER — SODIUM CHLORIDE 0.9 % IV SOLN
INTRAVENOUS | Status: DC
  Administered 2018-08-04: 10:00:00 via INTRAVENOUS

## 2018-08-04 MED ORDER — FENTANYL CITRATE (PF) 100 MCG/2ML IJ SOLN
INTRAMUSCULAR | Status: AC
  Filled 2018-08-04: qty 2

## 2018-08-04 MED ORDER — PROPOFOL 10 MG/ML IV BOLUS
INTRAVENOUS | Status: DC | PRN
  Administered 2018-08-04: 100 mg via INTRAVENOUS

## 2018-08-04 MED ORDER — FENTANYL CITRATE (PF) 100 MCG/2ML IJ SOLN
INTRAMUSCULAR | Status: DC | PRN
  Administered 2018-08-04 (×2): 50 ug via INTRAVENOUS

## 2018-08-04 MED ORDER — LIDOCAINE HCL (PF) 1 % IJ SOLN
INTRAMUSCULAR | Status: AC
  Administered 2018-08-04: 0.25 mL
  Filled 2018-08-04: qty 2

## 2018-08-04 MED ORDER — SODIUM CHLORIDE 0.9 % IV SOLN
INTRAVENOUS | Status: DC | PRN
  Administered 2018-08-04: 11:00:00 via INTRAVENOUS

## 2018-08-04 MED ORDER — MIDAZOLAM HCL 2 MG/2ML IJ SOLN
INTRAMUSCULAR | Status: AC
  Filled 2018-08-04: qty 2

## 2018-08-04 MED ORDER — MIDAZOLAM HCL 2 MG/2ML IJ SOLN
INTRAMUSCULAR | Status: DC | PRN
  Administered 2018-08-04: 2 mg via INTRAVENOUS

## 2018-08-04 NOTE — Anesthesia Preprocedure Evaluation (Signed)
Anesthesia Evaluation  Patient identified by MRN, date of birth, ID band Patient awake    Reviewed: Allergy & Precautions, NPO status , Patient's Chart, lab work & pertinent test results  History of Anesthesia Complications Negative for: history of anesthetic complications  Airway Mallampati: III  TM Distance: >3 FB Neck ROM: Full    Dental no notable dental hx.    Pulmonary neg sleep apnea, neg COPD, Current Smoker,    breath sounds clear to auscultation- rhonchi (-) wheezing      Cardiovascular Exercise Tolerance: Good (-) hypertension(-) CAD, (-) Past MI, (-) Cardiac Stents and (-) CABG  Rhythm:Regular Rate:Normal - Systolic murmurs and - Diastolic murmurs    Neuro/Psych neg Seizures PSYCHIATRIC DISORDERS Anxiety Depression negative neurological ROS     GI/Hepatic Neg liver ROS, GERD  ,  Endo/Other  negative endocrine ROSneg diabetes  Renal/GU negative Renal ROS     Musculoskeletal negative musculoskeletal ROS (+)   Abdominal (+) + obese,   Peds  Hematology negative hematology ROS (+)   Anesthesia Other Findings Past Medical History: No date: ADHD No date: Allergic rhinitis No date: Anxiety No date: Depression No date: OCD (obsessive compulsive disorder) No date: Panic disorder   Reproductive/Obstetrics                             Anesthesia Physical Anesthesia Plan  ASA: II  Anesthesia Plan: General   Post-op Pain Management:    Induction: Intravenous  PONV Risk Score and Plan: 1 and Propofol infusion  Airway Management Planned: Natural Airway  Additional Equipment:   Intra-op Plan:   Post-operative Plan:   Informed Consent: I have reviewed the patients History and Physical, chart, labs and discussed the procedure including the risks, benefits and alternatives for the proposed anesthesia with the patient or authorized representative who has indicated his/her  understanding and acceptance.   Dental advisory given  Plan Discussed with: CRNA and Anesthesiologist  Anesthesia Plan Comments:         Anesthesia Quick Evaluation

## 2018-08-04 NOTE — Transfer of Care (Signed)
Immediate Anesthesia Transfer of Care Note  Patient: Nicole Wade  Procedure(s) Performed: ESOPHAGOGASTRODUODENOSCOPY (EGD) WITH PROPOFOL (N/A )  Patient Location: PACU  Anesthesia Type:General  Level of Consciousness: awake, alert  and oriented  Airway & Oxygen Therapy: Patient Spontanous Breathing and Patient connected to nasal cannula oxygen  Post-op Assessment: Report given to RN and Post -op Vital signs reviewed and stable  Post vital signs: Reviewed and stable  Last Vitals:  Vitals Value Taken Time  BP    Temp    Pulse    Resp    SpO2      Last Pain:  Vitals:   08/04/18 0911  TempSrc: Tympanic         Complications: No apparent anesthesia complications

## 2018-08-04 NOTE — Anesthesia Postprocedure Evaluation (Signed)
Anesthesia Post Note  Patient: Alfonso PattenJade Elizabeth Kilroy  Procedure(s) Performed: ESOPHAGOGASTRODUODENOSCOPY (EGD) WITH PROPOFOL (N/A )  Patient location during evaluation: Endoscopy Anesthesia Type: General Level of consciousness: awake and alert and oriented Pain management: pain level controlled Vital Signs Assessment: post-procedure vital signs reviewed and stable Respiratory status: spontaneous breathing, nonlabored ventilation and respiratory function stable Cardiovascular status: blood pressure returned to baseline and stable Postop Assessment: no signs of nausea or vomiting Anesthetic complications: no     Last Vitals:  Vitals:   08/04/18 1120 08/04/18 1130  BP: 113/63 121/84  Pulse:    Resp:    Temp:    SpO2:      Last Pain:  Vitals:   08/04/18 1140  TempSrc:   PainSc: 0-No pain                 Mennie Spiller

## 2018-08-04 NOTE — Anesthesia Post-op Follow-up Note (Signed)
Anesthesia QCDR form completed.        
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
## 2018-08-04 NOTE — Op Note (Signed)
[facility] Patient Name: Nicole Wade Procedure Date: 08/04/2018 10:55 AM MRN: [id number] Account #: [id number] Date of Birth: 1991/03/07 Admit Type: Outpatient Age: 27 Room: [facility] ENDO ROOM 4 Gender: Female Note Status: Finalized Procedure:            Upper GI endoscopy Indications:          Follow-up of gastro-esophageal reflux disease Providers:            Wyline Mood MD, MD Referring MD:         Marzella Schlein. Bacigalupo (Referring MD) Medicines:            Monitored Anesthesia Care Complications:        No immediate complications. Procedure:            Pre-Anesthesia Assessment:                       - Prior to the procedure, a History and Physical was                        performed, and patient medications, allergies and                        sensitivities were reviewed. The patient's tolerance of                        previous anesthesia was reviewed.                       - The risks and benefits of the procedure and the                        sedation options and risks were discussed with the                        patient. All questions were answered and informed                        consent was obtained.                       - ASA Grade Assessment: II - A patient with mild                        systemic disease.                       After obtaining informed consent, the endoscope was                        passed under direct vision. Throughout the procedure,                        the patient's blood pressure, pulse, and oxygen                        saturations were monitored continuously. The Endoscope                        was introduced through the mouth, and advanced to the  third part of duodenum. The upper GI endoscopy was                        accomplished with ease. The patient tolerated the                        procedure well. Findings:      The esophagus was normal.      The stomach was  normal.      The examined duodenum was normal. Impression:           - Normal esophagus.                       - Normal stomach.                       - Normal examined duodenum.                       - No specimens collected. Recommendation:       - Discharge patient to home (with escort).                       - Resume previous diet.                       - Continue present medications.                       - Return to my office in 4 weeks. Procedure Code(s):    --- Professional ---                       [id number], Esophagogastroduodenoscopy, flexible, transoral;                        diagnostic, including collection of specimen(s) by                        brushing or washing, when performed (separate procedure) Diagnosis Code(s):    --- Professional ---                       K21.9, Gastro-esophageal reflux disease without                        esophagitis CPT copyright 2018 American Medical Association. All rights reserved. The codes documented in this report are preliminary and upon coder review may  be revised to meet current compliance requirements. Wyline MoodKiran Jamaree Hosier, MD Wyline MoodKiran Bowen Kia MD, MD 08/04/2018 11:05:20 AM This report has been signed electronically. Number of Addenda: 0 Note Initiated On: 08/04/2018 10:55 AM      [facility]

## 2018-08-30 ENCOUNTER — Encounter: Payer: Self-pay | Admitting: Family Medicine

## 2018-08-30 ENCOUNTER — Ambulatory Visit
Admission: RE | Admit: 2018-08-30 | Discharge: 2018-08-30 | Disposition: A | Discharge status: Home / Self Care | Payer: BLUE CROSS/BLUE SHIELD | Attending: Family Medicine | Admitting: Family Medicine

## 2018-08-30 ENCOUNTER — Ambulatory Visit (INDEPENDENT_AMBULATORY_CARE_PROVIDER_SITE_OTHER): Payer: BLUE CROSS/BLUE SHIELD | Admitting: Family Medicine

## 2018-08-30 ENCOUNTER — Ambulatory Visit
Admission: RE | Admit: 2018-08-30 | Discharge: 2018-08-30 | Disposition: A | Discharge status: Home / Self Care | Payer: BLUE CROSS/BLUE SHIELD | Source: Ambulatory Visit | Attending: Family Medicine | Admitting: Family Medicine

## 2018-08-30 DIAGNOSIS — M25561 Pain in right knee: Secondary | ICD-10-CM | POA: Diagnosis not present

## 2018-08-30 DIAGNOSIS — M25532 Pain in left wrist: Secondary | ICD-10-CM | POA: Insufficient documentation

## 2018-08-30 DIAGNOSIS — M549 Dorsalgia, unspecified: Secondary | ICD-10-CM

## 2018-08-30 DIAGNOSIS — M25562 Pain in left knee: Secondary | ICD-10-CM

## 2018-08-30 DIAGNOSIS — S3981XA Other specified injuries of abdomen, initial encounter: Secondary | ICD-10-CM | POA: Diagnosis not present

## 2018-08-30 DIAGNOSIS — M4040 Postural lordosis, site unspecified: Secondary | ICD-10-CM | POA: Diagnosis not present
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
## 2018-09-05 ENCOUNTER — Telehealth: Payer: Self-pay

## 2018-09-05 NOTE — Telephone Encounter (Signed)
Contacted patient about CT results. Patient was upset and requested that Dr. Beryle Flock contact her to discuss CT.

## 2018-09-06 NOTE — Telephone Encounter (Signed)
Discussed normal findings on CT scan with the patient.  She is concerned about the incidental finding of diverticuli.  Discussed that this is a normal variant and not related to any pain.  There is no inflammation in the area or signs of diverticulitis.  She is concerned about her obesity.  She states that she wants to lose her belly fat.  She states her aunt takes a shot that helps her lose weight.  She will make a f/u appt to discuss weight loss options

## 2018-09-21 ENCOUNTER — Ambulatory Visit (INDEPENDENT_AMBULATORY_CARE_PROVIDER_SITE_OTHER): Payer: BLUE CROSS/BLUE SHIELD | Admitting: Family Medicine

## 2018-09-21 ENCOUNTER — Encounter: Payer: Self-pay | Admitting: Family Medicine

## 2018-09-21 DIAGNOSIS — F17200 Nicotine dependence, unspecified, uncomplicated: Secondary | ICD-10-CM | POA: Diagnosis not present

## 2018-09-21 MED ORDER — PHENTERMINE HCL 37.5 MG PO CAPS: 37.5000 mg | ORAL_CAPSULE | ORAL | 2 refills | Status: DC

## 2018-09-21 NOTE — Patient Instructions (Signed)
Preventing Unhealthy Weight Gain, Adult  Staying at a healthy weight is important to your overall health. When fat builds up in your body, you may become overweight or obese. Being overweight or obese increases your risk of developing certain health problems, such as heart disease, diabetes, sleeping problems, joint problems, and some types of cancer.  Unhealthy weight gain is often the result of making unhealthy food choices or not getting enough exercise. You can make changes to your lifestyle to prevent obesity and stay as healthy as possible.  What nutrition changes can be made?    Eat only as much as your body needs. To do this:  Pay attention to signs that you are hungry or full. Stop eating as soon as you feel full.  If you feel hungry, try drinking water first before eating. Drink enough water so your urine is clear or pale yellow.  Eat smaller portions. Pay attention to portion sizes when eating out.  Look at serving sizes on food labels. Most foods contain more than one serving per container.  Eat the recommended number of calories for your gender and activity level. For most active people, a daily total of 2,000 calories is appropriate. If you are trying to lose weight or are not very active, you may need to eat fewer calories. Talk with your health care provider or a diet and nutrition specialist (dietitian) about how many calories you need each day.  Choose healthy foods, such as:  Fruits and vegetables. At each meal, try to fill at least half of your plate with fruits and vegetables.  Whole grains, such as whole-wheat bread, brown rice, and quinoa.  Lean meats, such as chicken or fish.  Other healthy proteins, such as beans, eggs, or tofu.  Healthy fats, such as nuts, seeds, fatty fish, and olive oil.  Low-fat or fat-free dairy products.  Check food labels, and avoid food and drinks that:  Are high in calories.  Have added sugar.  Are high in sodium.  Have saturated fats or trans fats.  Cook foods  in healthier ways, such as by baking, broiling, or grilling.  Make a meal plan for the week, and shop with a grocery list to help you stay on track with your purchases. Try to avoid going to the grocery store when you are hungry.  When grocery shopping, try to shop around the outside of the store first, where the fresh foods are. Doing this helps you to avoid prepackaged foods, which can be high in sugar, salt (sodium), and fat.  What lifestyle changes can be made?    Exercise for 30 or more minutes on 5 or more days each week. Exercising may include brisk walking, yard work, biking, running, swimming, and team sports like basketball and soccer. Ask your health care provider which exercises are safe for you.  Do muscle-strengthening activities, such as lifting weights or using resistance bands, on 2 or more days a week.  Do not use any products that contain nicotine or tobacco, such as cigarettes and e-cigarettes. If you need help quitting, ask your health care provider.  Limit alcohol intake to no more than 1 drink a day for nonpregnant women and 2 drinks a day for men. One drink equals 12 oz of beer, 5 oz of wine, or 1 oz of hard liquor.  Try to get 7-9 hours of sleep each night.  What other changes can be made?  Keep a food and activity journal to keep track of:    What you ate and how many calories you had. Remember to count the calories in sauces, dressings, and side dishes.  Whether you were active, and what exercises you did.  Your calorie, weight, and activity goals.  Check your weight regularly. Track any changes. If you notice you have gained weight, make changes to your diet or activity routine.  Avoid taking weight-loss medicines or supplements. Talk to your health care provider before starting any new medicine or supplement.  Talk to your health care provider before trying any new diet or exercise plan.  Why are these changes important?  Eating healthy, staying active, and having healthy habits can help  you to prevent obesity. Those changes also:  Help you manage stress and emotions.  Help you connect with friends and family.  Improve your self-esteem.  Improve your sleep.  Prevent long-term health problems.  What can happen if changes are not made?  Being obese or overweight can cause you to develop joint or bone problems, which can make it hard for you to stay active or do activities you enjoy. Being obese or overweight also puts stress on your heart and lungs and can lead to health problems like diabetes, heart disease, and some cancers.  Where to find more information  Talk with your health care provider or a dietitian about healthy eating and healthy lifestyle choices. You may also find information from:  U.[place]. Department of Agriculture, MyPlate: www.choosemyplate.gov  American Heart Association: www.heart.org  Centers for Disease Control and Prevention: www.cdc.gov  Summary  Staying at a healthy weight is important to your overall health. It helps you to prevent certain diseases and health problems, such as heart disease, diabetes, joint problems, sleep disorders, and some types of cancer.  Being obese or overweight can cause you to develop joint or bone problems, which can make it hard for you to stay active or do activities you enjoy.  You can prevent unhealthy weight gain by eating a healthy diet, exercising regularly, not smoking, limiting alcohol, and getting enough sleep.  Talk with your health care provider or a dietitian for guidance about healthy eating and healthy lifestyle choices.  This information is not intended to replace advice given to you by your health care provider. Make sure you discuss any questions you have with your health care provider.  Document Released: 08/11/2016 Document Revised: 05/21/2017 Document Reviewed: 09/16/2016  Elsevier Interactive Patient Education  2019 [employer]vier Inc[employer]

## 2018-09-21 NOTE — Assessment & Plan Note (Addendum)
Long discussion of diet and exercise She is worried that OCPs may be contributing to weight gain - she will discuss with Gyn and also consider IUD We will try 3 months of phentermine May consider Saxenda in the future Will also refer to nutrition

## 2018-09-21 NOTE — Assessment & Plan Note (Addendum)
Resumed smoking recently  3-5 minute discussion regarding the importance of cessation and health risks of ongoing smoking She will work on cessation and we will f/u at next visit
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
## 2018-09-22 ENCOUNTER — Other Ambulatory Visit: Payer: Self-pay | Admitting: Physician Assistant

## 2018-10-11 ENCOUNTER — Encounter: Payer: BLUE CROSS/BLUE SHIELD | Attending: Family Medicine | Admitting: Dietician

## 2018-10-11 ENCOUNTER — Encounter: Payer: Self-pay | Admitting: Dietician

## 2018-10-11 VITALS — Ht 61.0 in | Wt 215.0 lb

## 2018-10-11 DIAGNOSIS — E66813 Obesity, class 3: Secondary | ICD-10-CM

## 2018-10-11 DIAGNOSIS — Z6841 Body Mass Index (BMI) 40.0 and over, adult: Secondary | ICD-10-CM

## 2018-10-11 NOTE — Patient Instructions (Signed)
   Begin tracking typical food intake to estimate average current calorie intake.   Goal for calories is 1200-1400 daily; gradually work up to this level by adding 1 serving of healthy starch, or 1oz lean meat/ other protein 1-2 times a day every few weeks.   Continue with regular exercise, great job!Marland Kitchen

## 2018-10-11 NOTE — Progress Notes (Signed)
Medical Nutrition Therapy: Visit start time: 1030  end time: 1130  Assessment:  Diagnosis: obesity Past medical history: anxiety, depression Psychosocial issues/ stress concerns: anxiety, depression, pre-menstrual dysphoria disorder-- patient states she sees psychiatrist and therapist regularly  Preferred learning method:  . Hands-on   Current weight: 215.0lbs Height: 5'1" Medications, supplements: reconciled list in medical record  Progress and evaluation: Patient reports working on weight loss for the past 2-3 years; gained significant weight after depo-provera treatment; was about 130lbs 6 years ago. She lives with aunt who is a Pharmacist, community and provides support for patient. She lost about 100lbs, but hit a plateau/ regained some weight recently which she feels might be due in part to anxiety medications (lamictal, which has now been discontinued). Patient has been following low sugar, low carb pattern, followed keto diet for a time but was unable to sustain and does not feel it is healthy long-term. She seeks help in restarting weight loss healthfully and sustainably. Voices anxiety over health, death risk with obesity.   Physical activity: elliptical 60+ minuetes, 2-3x a week   Dietary Intake:  Usual eating pattern includes 3 meals and 0-2 snacks per day. Dining out frequency: 3 meals per week. Cooks ahead due to schedule -- full time work + evening classes  Breakfast: peaches or jello cup, or cuties oranges; was fasting prior to starting [employer]: tries to avoid Lunch: 11:30-12-- occ Ramen noodles; usually leftovers chicken teriyaki with veg; cheese and crackers, or cheese and cuties Snack: none Supper: chicken teriyaki with veg; [employer] chicken or burger sometimes on class nights (6-10pm) 1-2x a week Snack: craves sweets -- nerds, starburst, similar (provides energy boost) Beverages: water only; rarely tropical cafe smoothie  Nutrition Care Education: Topics covered:  weight control Basic nutrition: basic food groups, appropriate nutrient balance-- discussed role of carbohydrate and protein with energy and weight control, appropriate meal and snack schedule, general nutrition guidelines    Weight control: benefits of weight control, health risk associated with obesity and spectrum of healthy people at varying body weights; typical weight loss patterns/ normal weight fluctuation; estimated caloric needs for weight lass at 1200-1400; provided guidance for 1300kcal daily intake using plate method and food models (40% CHO, 30% protein, 30% fat); discussed role of physical activity; discussed benefits of tracking food intake. Advanced nutrition:  cooking techniques-- options for portable meals and snacks including meal-in-a-jar, healthy sandwich options, use of protein/ meal replacement shakes   Nutritional Diagnosis:  [place]-3.3 Overweight/obesity As related to history of excess calories and inactivity, stress/ anxiety, medications.  As evidenced by patient with current BMI of 40.62, and patient report of dietary and health history.  Intervention:   Instruction as noted above.  Patient has been working diligently to lose weight by managing diet and physical activity.  Per discussion, patient might be eating less than 1000kcal daily at this time; she will plan to track daily intake and gradually increase to at least 1200kcal daily.   Patient declined scheduling follow-up at this time, but will schedule later if needed.   Education Materials given:  . Plate Planner with food lists . Sample meal pattern for 1300kcal . Sample menus . Snacking handout . Goals/ instructions   Learner/ who was taught:  . Patient   Level of understanding: Marland Kitchen Verbalizes/ demonstrates competency   Demonstrated degree of understanding via:   Teach back Learning barriers: . None   Willingness to learn/ readiness for change: . Eager, change in progress   Monitoring and  Evaluation:  Dietary intake, exercise, and body weight      follow up: prn

## 2018-10-18 ENCOUNTER — Ambulatory Visit: Payer: BLUE CROSS/BLUE SHIELD | Admitting: Physician Assistant

## 2018-10-18 ENCOUNTER — Encounter: Payer: Self-pay | Admitting: Physician Assistant

## 2018-10-18 VITALS — BP 124/89 | HR 88 | Temp 99.2°F | Wt 215.6 lb

## 2018-10-18 DIAGNOSIS — R062 Wheezing: Secondary | ICD-10-CM | POA: Diagnosis not present

## 2018-10-18 DIAGNOSIS — R69 Illness, unspecified: Secondary | ICD-10-CM

## 2018-10-18 DIAGNOSIS — J111 Influenza due to unidentified influenza virus with other respiratory manifestations: Secondary | ICD-10-CM

## 2018-10-18 MED ORDER — OSELTAMIVIR PHOSPHATE 75 MG PO CAPS: 75.0000 mg | ORAL_CAPSULE | Freq: Two times a day (BID) | ORAL | 0 refills | Status: AC

## 2018-10-18 MED ORDER — ALBUTEROL SULFATE HFA 108 (90 BASE) MCG/ACT IN AERS: 2.0000 | INHALATION_SPRAY | Freq: Four times a day (QID) | RESPIRATORY_TRACT | 2 refills | Status: AC | PRN

## 2018-10-18 NOTE — Patient Instructions (Signed)

## 2018-10-18 NOTE — Progress Notes (Signed)
Patient: Nicole Wade Female    DOB: [birth year]   28 y.o.   MRN: [id number] Visit Date: 10/18/2018  Today's Provider: Trey Sailors, PA-C   Chief Complaint  Patient presents with  . URI   Subjective:     URI   This is a new problem. The current episode started yesterday. The problem has been gradually worsening. There has been no fever. Associated symptoms include chest pain (pt reports alot of pressure in upper chest), congestion, coughing, headaches, nausea, sneezing, a sore throat, swollen glands and vomiting. Treatments tried: Generic cold and flu. The treatment provided no relief.   Her roommate is a Engineer, civil (consulting) who works in the ICU and has been sick as well. She did not get her flu shot this year. She stopped smoking one month ago. She is concerned because she has some tightness in her chest when she takes a very deep breath and she is concerned this represents pneumonia. Also concerned about certain medications she might take for this interacting with her current medications, namely klonopin.   Allergies  Allergen Reactions  . Azithromycin Hives  . Penicillins Hives  . Sulfa Antibiotics Hives  . Prednisone Palpitations    Caused mental distress     Current Outpatient Medications:  .  clonazePAM (KLONOPIN) 1 MG tablet, Take 0.5-1 tablets (0.5-1 mg total) by mouth 2 (two) times daily as needed for anxiety., Disp: 45 tablet, Rfl: 2 .  lamoTRIgine (LAMICTAL) 200 MG tablet, Take 200 mg by mouth at bedtime., Disp: , Rfl: 0 .  MILI 0.25-35 MG-MCG tablet, Take 1 tablet by mouth daily. Skip placebo pills, take continuously, Disp: 4 Package, Rfl: 3 .  omeprazole (PRILOSEC) 40 MG capsule, Take 1 capsule (40 mg total) by mouth daily., Disp: 90 capsule, Rfl: 3 .  VYVANSE 30 MG capsule, , Disp: , Rfl: 0 .  albuterol (PROVENTIL HFA;VENTOLIN HFA) 108 (90 Base) MCG/ACT inhaler, Inhale 2 puffs into the lungs every 6 (six) hours as needed for wheezing or shortness of breath.,  Disp: 1 Inhaler, Rfl: 2 .  dexlansoprazole (DEXILANT) 60 MG capsule, Take 1 capsule (60 mg total) by mouth daily., Disp: 30 capsule, Rfl: 1 .  hydrOXYzine (ATARAX/VISTARIL) 25 MG tablet, , Disp: , Rfl:  .  oseltamivir (TAMIFLU) 75 MG capsule, Take 1 capsule (75 mg total) by mouth 2 (two) times daily for 5 days., Disp: 10 capsule, Rfl: 0 .  UNABLE TO FIND, Med Name: L Methyldopate 15 mg, Disp: , Rfl:   Review of Systems  Constitutional: Positive for appetite change, chills and fatigue.  HENT: Positive for congestion, sinus pressure, sneezing and sore throat.   Respiratory: Positive for cough, chest tightness and shortness of breath.   Cardiovascular: Positive for chest pain (pt reports alot of pressure in upper chest).  Gastrointestinal: Positive for nausea and vomiting.  Neurological: Positive for dizziness, light-headedness and headaches.    Social History   Tobacco Use  . Smoking status: Former Smoker    Years: 11.00    Types: E-cigarettes    Last attempt to quit: 09/27/2018    Years since quitting: 0.0  . Smokeless tobacco: Former Neurosurgeon    Types: Snuff    Quit date: 03/23/2016  . Tobacco comment: started smoking at age 63  Substance Use Topics  . Alcohol use: No      Objective:   BP 124/89 (BP Location: Left Arm, Patient Position: Sitting, Cuff Size: Normal)   Pulse 88  Temp 99.2 F (37.3 C) (Oral)   Wt 215 lb 9.6 oz (97.8 kg)   SpO2 99%   BMI 40.74 kg/m  Vitals:   10/18/18 1506  BP: 124/89  Pulse: 88  Temp: 99.2 F (37.3 C)  TempSrc: Oral  SpO2: 99%  Weight: 215 lb 9.6 oz (97.8 kg)     Physical Exam Constitutional:      Appearance: Normal appearance. She is ill-appearing.  HENT:     Right Ear: Tympanic membrane and ear canal normal.     Left Ear: Tympanic membrane and ear canal normal.     Mouth/Throat:     Mouth: Mucous membranes are dry.  Cardiovascular:     Rate and Rhythm: Normal rate and regular rhythm.     Heart sounds: Normal heart sounds.    Pulmonary:     Effort: Pulmonary effort is normal.     Breath sounds: Normal breath sounds.  Skin:    General: Skin is warm and dry.  Neurological:     Mental Status: She is alert and oriented to person, place, and time. Mental status is at baseline.  Psychiatric:        Mood and Affect: Mood normal.        Behavior: Behavior normal.         Assessment & Plan    1. Wheezing  - albuterol (PROVENTIL HFA;VENTOLIN HFA) 108 (90 Base) MCG/ACT inhaler; Inhale 2 puffs into the lungs every 6 (six) hours as needed for wheezing or shortness of breath.  Dispense: 1 Inhaler; Refill: 2  2. Influenza-like illness  We are out of flu swabs right now but she clinically appears to have flu or influenza like illness. Will treat as below. Counseled this will not interact with her medication.   - oseltamivir (TAMIFLU) 75 MG capsule; Take 1 capsule (75 mg total) by mouth 2 (two) times daily for 5 days.  Dispense: 10 capsule; Refill: 0  The entirety of the information documented in the History of Present Illness, Review of Systems and Physical Exam were personally obtained by me. Portions of this information were initially documented by Rondel Baton, CMA and reviewed by me for thoroughness and accuracy.   Return if symptoms worsen or fail to improve.      Trey Sailors, PA-C  [facility]

## 2018-10-24 ENCOUNTER — Encounter (HOSPITAL_COMMUNITY): Payer: Self-pay

## 2018-10-24 ENCOUNTER — Emergency Department (HOSPITAL_COMMUNITY)
Admission: EM | Admit: 2018-10-24 | Discharge: 2018-10-25 | Disposition: A | Discharge status: Home / Self Care | Payer: BLUE CROSS/BLUE SHIELD | Attending: Emergency Medicine | Admitting: Emergency Medicine

## 2018-10-24 ENCOUNTER — Other Ambulatory Visit: Payer: Self-pay

## 2018-10-24 DIAGNOSIS — Z5321 Procedure and treatment not carried out due to patient leaving prior to being seen by health care provider: Secondary | ICD-10-CM | POA: Diagnosis not present

## 2018-10-24 DIAGNOSIS — F419 Anxiety disorder, unspecified: Secondary | ICD-10-CM | POA: Insufficient documentation

## 2018-10-24 LAB — BASIC METABOLIC PANEL
ANION GAP: 11 (ref 5–15)
BUN: 13 mg/dL (ref 6–20)
CO2: 23 mmol/L (ref 22–32)
Calcium: 9.5 mg/dL (ref 8.9–10.3)
Chloride: 104 mmol/L (ref 98–111)
Creatinine, Ser: 0.82 mg/dL (ref 0.44–1.00)
GFR calc Af Amer: >60 mL/min (ref >60)
Glucose, Bld: 120 mg/dL — ABNORMAL HIGH (ref 70–99)
Potassium: 3.6 mmol/L (ref 3.5–5.1)
Sodium: 138 mmol/L (ref 135–145)

## 2018-10-24 LAB — URINALYSIS, ROUTINE W REFLEX MICROSCOPIC
Bilirubin Urine: NEGATIVE
Glucose, UA: NEGATIVE mg/dL
Hgb urine dipstick: NEGATIVE
Ketones, ur: NEGATIVE mg/dL
LEUKOCYTE UA: NEGATIVE
Nitrite: NEGATIVE
PROTEIN: NEGATIVE mg/dL
Specific Gravity, Urine: 1.003 — ABNORMAL LOW (ref 1.005–1.030)
pH: 8 (ref 5.0–8.0)

## 2018-10-24 LAB — I-STAT BETA HCG BLOOD, ED (MC, WL, AP ONLY)

## 2018-10-24 NOTE — ED Triage Notes (Signed)
Pt complains of pressure in the front of her head and pain under her left arm, she states that she's very dizzy and the pressure is causing her to have blurred vision Pt states that her blood pressure was elevated this afternoon when these symptoms began

## 2018-10-25 ENCOUNTER — Ambulatory Visit: Payer: BLUE CROSS/BLUE SHIELD | Admitting: Physician Assistant

## 2018-10-25 ENCOUNTER — Telehealth: Payer: Self-pay | Admitting: Family Medicine

## 2018-10-25 ENCOUNTER — Telehealth: Payer: Self-pay

## 2018-10-25 ENCOUNTER — Encounter: Payer: Self-pay | Admitting: Physician Assistant

## 2018-10-25 VITALS — BP 115/88 | HR 84 | Temp 98.4°F | Resp 16 | Wt 215.0 lb

## 2018-10-25 DIAGNOSIS — B373 Candidiasis of vulva and vagina: Secondary | ICD-10-CM

## 2018-10-25 DIAGNOSIS — J0110 Acute frontal sinusitis, unspecified: Secondary | ICD-10-CM

## 2018-10-25 DIAGNOSIS — B3731 Acute candidiasis of vulva and vagina: Secondary | ICD-10-CM

## 2018-10-25 MED ORDER — FLUCONAZOLE 150 MG PO TABS: ORAL_TABLET | ORAL | 0 refills | Status: DC

## 2018-10-25 MED ORDER — DOXYCYCLINE HYCLATE 100 MG PO TABS: 100.0000 mg | ORAL_TABLET | Freq: Two times a day (BID) | ORAL | 0 refills | Status: AC

## 2018-10-25 NOTE — Telephone Encounter (Signed)
Noted, thanks. I did explain extensively in clinic that she had a viral flu like illness one week ago and that a bacterial sinus infection is an expected consequence of a respiratory virus. If she feels this is something completely different and has intolerable symptoms she can be further evaluated.

## 2018-10-25 NOTE — Telephone Encounter (Signed)
Patient scheduled with Adriana at 9:40 today.

## 2018-10-25 NOTE — ED Triage Notes (Signed)
Pt not seen in lobby 

## 2018-10-25 NOTE — Telephone Encounter (Signed)
Patient called requesting to speak with a nurse about her office visit today and last week. She has been reviewing the office notes on mychart and is confused with the diagnosis. She states that last week she was diagnosed with flu, and today she was diagnosed with a sinus infection. She is confused as to what she actually has and wants to speak with a nurse about the diagnoses and her symptoms. Patient call back [phone]

## 2018-10-25 NOTE — Addendum Note (Signed)
Addended by: Trey Sailors on: 10/25/2018 02:49 PM   Modules accepted: Level of Service

## 2018-10-25 NOTE — Telephone Encounter (Signed)
Pt went to hospital last night with BP reading of 150/110 and double vision. Pt waited in the ER for 5 hours only thing that was done was her BP and blood was taken. Wanting to know if she can be seen today.  Please call her back asap.  Please advise.  Thanks, [employer]

## 2018-10-25 NOTE — Telephone Encounter (Signed)
FYI..   Pt is concerned that she has worsening symptoms of the Flu and does not have a sinus infection.  She states she tried to take a nap after her appointment and could not sleep secondary to her "heart pounding and increasing shortness of breath".  I advised her with her symptoms she will need to be treated at the ER.  She states she is not going back to the ER because with will only "Treat my Anxiety"  She states she is going to a second opinion and then hung up the phone.   Thanks,   -Vernona Rieger

## 2018-10-25 NOTE — Patient Instructions (Signed)
Coricidin decongestant that won't raise blood pressure.    Sinusitis, Adult Sinusitis is soreness and swelling (inflammation) of your sinuses. Sinuses are hollow spaces in the bones around your face. They are located:  Around your eyes.  In the middle of your forehead.  Behind your nose.  In your cheekbones. Your sinuses and nasal passages are lined with a fluid called mucus. Mucus drains out of your sinuses. Swelling can trap mucus in your sinuses. This lets germs (bacteria, virus, or fungus) grow, which leads to infection. Most of the time, this condition is caused by a virus. What are the causes? This condition is caused by:  Allergies.  Asthma.  Germs.  Things that block your nose or sinuses.  Growths in the nose (nasal polyps).  Chemicals or irritants in the air.  Fungus (rare). What increases the risk? You are more likely to develop this condition if:  You have a weak body defense system (immune system).  You do a lot of swimming or diving.  You use nasal sprays too much.  You smoke. What are the signs or symptoms? The main symptoms of this condition are pain and a feeling of pressure around the sinuses. Other symptoms include:  Stuffy nose (congestion).  Runny nose (drainage).  Swelling and warmth in the sinuses.  Headache.  Toothache.  A cough that may get worse at night.  Mucus that collects in the throat or the back of the nose (postnasal drip).  Being unable to smell and taste.  Being very tired (fatigue).  A fever.  Sore throat.  Bad breath. How is this diagnosed? This condition is diagnosed based on:  Your symptoms.  Your medical history.  A physical exam.  Tests to find out if your condition is short-term (acute) or long-term (chronic). Your doctor may: ? Check your nose for growths (polyps). ? Check your sinuses using a tool that has a light (endoscope). ? Check for allergies or germs. ? Do imaging tests, such as an MRI or  CT scan. How is this treated? Treatment for this condition depends on the cause and whether it is short-term or long-term.  If caused by a virus, your symptoms should go away on their own within 10 days. You may be given medicines to relieve symptoms. They include: ? Medicines that shrink swollen tissue in the nose. ? Medicines that treat allergies (antihistamines). ? A spray that treats swelling of the nostrils. ? Rinses that help get rid of thick mucus in your nose (nasal saline washes).  If caused by bacteria, your doctor may wait to see if you will get better without treatment. You may be given antibiotic medicine if you have: ? A very bad infection. ? A weak body defense system.  If caused by growths in the nose, you may need to have surgery. Follow these instructions at home: Medicines  Take, use, or apply over-the-counter and prescription medicines only as told by your doctor. These may include nasal sprays.  If you were prescribed an antibiotic medicine, take it as told by your doctor. Do not stop taking the antibiotic even if you start to feel better. Hydrate and humidify   Drink enough water to keep your pee (urine) pale yellow.  Use a cool mist humidifier to keep the humidity level in your home above 50%.  Breathe in steam for 10-15 minutes, 3-4 times a day, or as told by your doctor. You can do this in the bathroom while a hot shower is running.  Try not to spend time in cool or dry air. Rest  Rest as much as you can.  Sleep with your head raised (elevated).  Make sure you get enough sleep each night. General instructions   Put a warm, moist washcloth on your face 3-4 times a day, or as often as told by your doctor. This will help with discomfort.  Wash your hands often with soap and water. If there is no soap and water, use hand sanitizer.  Do not smoke. Avoid being around people who are smoking (secondhand smoke).  Keep all follow-up visits as told by  your doctor. This is important. Contact a doctor if:  You have a fever.  Your symptoms get worse.  Your symptoms do not get better within 10 days. Get help right away if:  You have a very bad headache.  You cannot stop throwing up (vomiting).  You have very bad pain or swelling around your face or eyes.  You have trouble seeing.  You feel confused.  Your neck is stiff.  You have trouble breathing. Summary  Sinusitis is swelling of your sinuses. Sinuses are hollow spaces in the bones around your face.  This condition is caused by tissues in your nose that become inflamed or swollen. This traps germs. These can lead to infection.  If you were prescribed an antibiotic medicine, take it as told by your doctor. Do not stop taking it even if you start to feel better.  Keep all follow-up visits as told by your doctor. This is important. This information is not intended to replace advice given to you by your health care provider. Make sure you discuss any questions you have with your health care provider. Document Released: 01/27/2008 Document Revised: 01/10/2018 Document Reviewed: 01/10/2018 Elsevier Interactive Patient Education  2019 [employer].

## 2018-10-25 NOTE — Progress Notes (Addendum)
Patient: Nicole Wade Female    DOB: 12/03/1990   28 y.o.   MRN: [id number] Visit Date: 10/25/2018  Today's Provider: Trey Sailors, PA-C   Chief Complaint  Patient presents with  . Dizziness  . Headache  . Follow-up    Pt in the ER 10/24/2018   Subjective:     HPI   Reports last night she felt a lot of pressure in her face, reports nausea. 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 Reports she was driving home and "the lines on the road started to separate" which scared her and brought her to the ER and [facility]. She got blood drawn and was sitting for 6 hours without being seen. She left without being sick. She had a CMET which was unremarkable, urinalysis which was negative and pregnancy test which was negative.   Patient was seen in this clinic on 10/18/2018 with a flu like illnesses. Was treated with Tamiflu and feel like she got better. Now reports feeling worse, significant facial pressure and congestion. She also reports some "softness" in her abdomen.    Follow up ER visit  Patient was seen in ER for Dizziness and blurred vision on 10/24/2018. She was treated for nothing.  Pt stated she left without being seen. Treatment for this included Lab work. She reports poor compliance with treatment. She reports this condition is Unchanged.  BP Readings from Last 3 Encounters:  10/25/18 115/88  10/18/18 124/89  09/21/18 126/83    ------------------------------------------------------------------------------------    Allergies  Allergen Reactions  . Azithromycin Hives  . Penicillins Hives  . Sulfa Antibiotics Hives  . Prednisone Palpitations    Caused mental distress     Current Outpatient Medications:  .  albuterol (PROVENTIL HFA;VENTOLIN HFA) 108 (90 Base) MCG/ACT inhaler, Inhale 2 puffs into the lungs every 6 (six) hours as needed for wheezing or shortness of breath., Disp: 1 Inhaler, Rfl: 2 .   clonazePAM (KLONOPIN) 1 MG tablet, Take 0.5-1 tablets (0.5-1 mg total) by mouth 2 (two) times daily as needed for anxiety., Disp: 45 tablet, Rfl: 2 .  hydrOXYzine (ATARAX/VISTARIL) 25 MG tablet, , Disp: , Rfl:  .  lamoTRIgine (LAMICTAL) 200 MG tablet, Take 200 mg by mouth daily. , Disp: , Rfl: 0 .  MILI 0.25-35 MG-MCG tablet, Take 1 tablet by mouth daily. Skip placebo pills, take continuously, Disp: 4 Package, Rfl: 3 .  omeprazole (PRILOSEC) 40 MG capsule, Take 1 capsule (40 mg total) by mouth daily., Disp: 90 capsule, Rfl: 3 .  Vilazodone HCl (VIIBRYD) 10 MG TABS, Take by mouth daily., Disp: , Rfl:  .  VYVANSE 30 MG capsule, , Disp: , Rfl: 0 .  dexlansoprazole (DEXILANT) 60 MG capsule, Take 1 capsule (60 mg total) by mouth daily., Disp: 30 capsule, Rfl: 1 .  UNABLE TO FIND, Med Name: L Methyldopate 15 mg, Disp: , Rfl:   Review of Systems  Constitutional: Positive for chills, diaphoresis and fatigue. Negative for activity change, appetite change, fever and unexpected weight change.  HENT: Positive for sinus pressure, sinus pain and trouble swallowing. Negative for sore throat.   Respiratory: Positive for cough, chest tightness, shortness of breath and wheezing. Negative for apnea, choking and stridor.   Gastrointestinal: Positive for abdominal distention, abdominal pain (Upper bilateral abdominal pain.) and nausea. Negative for anal bleeding, blood in stool, constipation, diarrhea, rectal pain and vomiting.  Neurological: Positive for dizziness and headaches. Negative for  light-headedness.    Social History   Tobacco Use  . Smoking status: Former Smoker    Years: 11.00    Types: E-cigarettes    Last attempt to quit: 09/27/2018    Years since quitting: 0.0  . Smokeless tobacco: Former Neurosurgeon    Types: Snuff    Quit date: 03/23/2016  . Tobacco comment: started smoking at age 11  Substance Use Topics  . Alcohol use: No      Objective:   BP 115/88 (BP Location: Left Arm, Patient Position:  Sitting, Cuff Size: Large)   Pulse 84   Temp 98.4 F (36.9 C) (Oral)   Resp 16   Wt 215 lb (97.5 kg)   BMI 40.62 kg/m  Vitals:   10/25/18 0942  BP: 115/88  Pulse: 84  Resp: 16  Temp: 98.4 F (36.9 C)  TempSrc: Oral  Weight: 215 lb (97.5 kg)     Physical Exam Constitutional:      General: She is not in acute distress.    Appearance: She is well-developed. She is not diaphoretic.  HENT:     Right Ear: External ear normal.     Left Ear: External ear normal.     Nose:     Right Sinus: Maxillary sinus tenderness and frontal sinus tenderness present.     Left Sinus: Maxillary sinus tenderness and frontal sinus tenderness present.     Mouth/Throat:     Pharynx: No oropharyngeal exudate or posterior oropharyngeal erythema.  Eyes:     General:        Right eye: No discharge.        Left eye: No discharge.     Conjunctiva/sclera: Conjunctivae normal.  Neck:     Musculoskeletal: Neck supple.  Cardiovascular:     Rate and Rhythm: Normal rate and regular rhythm.  Pulmonary:     Effort: Pulmonary effort is normal.     Breath sounds: Normal breath sounds.  Abdominal:     General: Bowel sounds are normal.     Palpations: Abdomen is soft.     Tenderness: There is no abdominal tenderness.     Comments: Some mild abdominal tenderness that lessens with distraction.   Lymphadenopathy:     Cervical: Cervical adenopathy present.  Skin:    General: Skin is warm and dry.  Neurological:     Mental Status: She is alert and oriented to person, place, and time.  Psychiatric:        Behavior: Behavior normal.         Assessment & Plan    1. Acute non-recurrent frontal sinusitis  Reassured patient that blood pressure is completely normal and has been for last several visits. Encouraged to call clinic with concerns regarding this. Treat as below for sinus infection. I have also reviewed her labwork from [facility] ER which showed a normal CMET, negative pregnancy, and normal urinalysis. She  has significant anxiety relating to her medical symptoms and is frequently seen in the emergency room but leaves before being seen. I have had a very long conversation with patient about how her symptoms from a viral upper respiratory infection can create a good background for a bacterial sinus infection and this is what we are treating her for today. I have also answered her many and numerous questions regarding her epigastric pain, which I feel is likely GERD. I have also answered her questions about her right arm bruise from her IV last night and how I do not think this  looks infected today. I think it looks like a bruise and is edematous due to the blood in her subcutaneous tissue. 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   - doxycycline (VIBRA-TABS) 100 MG tablet; Take 1 tablet (100 mg total) by mouth 2 (two) times daily for 7 days.  Dispense: 14 tablet; Refill: 0  2. Vaginal candida  - fluconazole (DIFLUCAN) 150 MG tablet; Take one pill on day 1. Take second pill 3 days later if symptoms persist.  Dispense: 2 tablet; Refill: 0  The entirety of the information documented in the History of Present Illness, Review of Systems and Physical Exam were personally obtained by me. Portions of this information were initially documented by Kavin Leech, CMA and reviewed by me for thoroughness and accuracy.   I have spent 25 minutes with this patient, >50% of which was spent on counseling and coordination of care.   Return if symptoms worsen or fail to improve.     Trey Sailors, PA-C  [facility]

## 2018-11-09 ENCOUNTER — Ambulatory Visit: Payer: BLUE CROSS/BLUE SHIELD | Admitting: Gastroenterology

## 2018-12-21 ENCOUNTER — Ambulatory Visit: Payer: BLUE CROSS/BLUE SHIELD | Admitting: Family Medicine

## 2019-01-18 ENCOUNTER — Other Ambulatory Visit: Payer: Self-pay

## 2019-01-18 ENCOUNTER — Encounter: Payer: Self-pay | Admitting: Physician Assistant

## 2019-01-18 ENCOUNTER — Ambulatory Visit: Payer: BLUE CROSS/BLUE SHIELD | Admitting: Physician Assistant

## 2019-01-18 VITALS — BP 100/60 | HR 90 | Temp 98.8°F | Resp 16 | Wt 221.6 lb

## 2019-01-18 DIAGNOSIS — K219 Gastro-esophageal reflux disease without esophagitis: Secondary | ICD-10-CM | POA: Diagnosis not present

## 2019-01-18 DIAGNOSIS — R1902 Left upper quadrant abdominal swelling, mass and lump: Secondary | ICD-10-CM

## 2019-01-18 DIAGNOSIS — H6981 Other specified disorders of Eustachian tube, right ear: Secondary | ICD-10-CM

## 2019-01-18 DIAGNOSIS — K5904 Chronic idiopathic constipation: Secondary | ICD-10-CM

## 2019-01-18 MED ORDER — LINACLOTIDE 145 MCG PO CAPS: 145.0000 ug | ORAL_CAPSULE | Freq: Every day | ORAL | 1 refills | Status: AC

## 2019-01-18 MED ORDER — PANTOPRAZOLE SODIUM 40 MG PO TBEC: 40.0000 mg | DELAYED_RELEASE_TABLET | Freq: Every day | ORAL | 1 refills | Status: AC

## 2019-01-18 NOTE — Patient Instructions (Signed)
Musculoskeletal Ultrasound A musculoskeletal ultrasound is a type of imaging test that creates images of the inside of your body. This is a painless test that is done using a handheld device (transducer) that sends sound waves into your body. The sound waves bounce back to the transducer and are sent to a computer. The computer makes moving images of your body that can be seen on the monitor. The images are recorded and saved. These images help your health care provider learn more about your injury or condition. This type of ultrasound can be used on muscles, tendons, ligaments, joints, and soft tissues around muscles and joints. Why do I need this procedure? You may need this procedure to diagnose conditions such as:  Tendon or ligament injuries.  Muscle tears or tumors.  Soft tissue tumors or cysts.  Joint or nerve problems.  Hernias.  Foreign bodies that have entered soft tissue, such as splinters or glass. What are the risks? There are no risks associated with a musculoskeletal ultrasound. This procedure does not use radiation. What happens before the procedure? You will need to wear loose, comfortable clothing to the procedure. You may also be asked to remove all jewelry. No other preparation is needed. What happens during the procedure?   You will remove clothing that covers the part of your body that will be examined.  A slippery gel will be rubbed over your skin. This helps the transducer slide easily over the skin.  After you lie down on an exam table, the transducer will be moved back and forth over the affected area. You may feel pressure but not pain.  Images of the inside of your body will be sent to the computer. You may be asked to change positions or hold your breath at times.  When the procedure is over, the gel will be wiped off, and you can get dressed.  Your images will be sent to your health care provider to be reviewed. What can I expect after the procedure?  You can go home right after the procedure and return to your normal activities as told by your health care provider. Follow these instructions at home:  It is up to you to get the results of your procedure. Ask your health care provider, or the department that is doing the procedure, when your results will be ready.  Return to your normal activities as told by your health care provider. Ask your health care provider what activities are safe for you.  Take over-the-counter and prescription medicines only as told by your health care provider.  Keep all follow-up visits as told by your health care provider. This is important. Summary  A musculoskeletal ultrasound is an imaging test of your muscles, tendons, ligaments, joints, and the soft tissues around muscles and joints.  Ultrasound imaging does not use radiation. There are no risks.  Wear loose, comfortable clothing to the procedure. You may also be asked to remove all jewelry.  You can go home right after the procedure and return to your normal activities as told by your health care provider. This information is not intended to replace advice given to you by your health care provider. Make sure you discuss any questions you have with your health care provider. Document Released: 11/11/2017 Document Revised: 11/11/2017 Document Reviewed: 11/11/2017 Elsevier Interactive Patient Education  2019 [employer].

## 2019-01-18 NOTE — Progress Notes (Signed)
Patient: Nicole Wade Female    DOB: 14-Feb-1991   28 y.o.   MRN: [id number] Visit Date: 01/18/2019  Today's Provider: Margaretann Loveless, PA-C   Chief Complaint  Patient presents with  . Abdominal Pain   Subjective:     Abdominal Pain  This is a chronic problem. Episode onset: Flare up since 07/2018 and she has been evaluated by GI. The pain is located in the generalized abdominal region and epigastric region (She also has a lump in her LUQ-she has also dx by GI with Diverticulitis). The quality of the pain is sharp (comes and goes). The abdominal pain radiates to the chest and epigastric region. Associated symptoms include constipation and nausea (off and on). Pertinent negatives include no diarrhea, headaches or vomiting. The pain is aggravated by movement. The pain is relieved by nothing. She has tried antacids (Omeprazole) for the symptoms.   She also feels like she is having some vertigo. Reports that her right ear bothers her and when it does she feels off balance. She denies any spinning of the room or her spinning, but has sensation like she is on a boat.   Allergies  Allergen Reactions  . Azithromycin Hives  . Penicillins Hives  . Sulfa Antibiotics Hives  . Prednisone Palpitations    Caused mental distress     Current Outpatient Medications:  .  albuterol (PROVENTIL HFA;VENTOLIN HFA) 108 (90 Base) MCG/ACT inhaler, Inhale 2 puffs into the lungs every 6 (six) hours as needed for wheezing or shortness of breath., Disp: 1 Inhaler, Rfl: 2 .  clonazePAM (KLONOPIN) 1 MG tablet, Take 0.5-1 tablets (0.5-1 mg total) by mouth 2 (two) times daily as needed for anxiety., Disp: 45 tablet, Rfl: 2 .  CONCERTA 18 MG CR tablet, Take 18 mg by mouth daily., Disp: , Rfl:  .  lamoTRIgine (LAMICTAL) 200 MG tablet, Take 200 mg by mouth daily. , Disp: , Rfl: 0 .  MILI 0.25-35 MG-MCG tablet, Take 1 tablet by mouth daily. Skip placebo pills, take continuously, Disp: 4 Package, Rfl:  3 .  omeprazole (PRILOSEC) 40 MG capsule, Take 1 capsule (40 mg total) by mouth daily., Disp: 90 capsule, Rfl: 3 .  UNABLE TO FIND, Med Name: L Methyldopate 15 mg, Disp: , Rfl:  .  Vilazodone HCl (VIIBRYD) 10 MG TABS, Take by mouth daily., Disp: , Rfl:  .  dexlansoprazole (DEXILANT) 60 MG capsule, Take 1 capsule (60 mg total) by mouth daily., Disp: 30 capsule, Rfl: 1 .  fluconazole (DIFLUCAN) 150 MG tablet, Take one pill on day 1. Take second pill 3 days later if symptoms persist. (Patient not taking: Reported on 01/18/2019), Disp: 2 tablet, Rfl: 0  Review of Systems  Constitutional: Negative.   HENT: Positive for ear pain (fullness right ear). Negative for congestion and ear discharge.   Eyes: Negative for visual disturbance.  Respiratory: Negative.   Cardiovascular: Negative.   Gastrointestinal: Positive for abdominal pain, constipation and nausea (off and on). Negative for diarrhea and vomiting.  Neurological: Negative for headaches.    Social History   Tobacco Use  . Smoking status: Former Smoker    Years: 11.00    Types: E-cigarettes    Last attempt to quit: 09/27/2018    Years since quitting: 0.3  . Smokeless tobacco: Former Neurosurgeon    Types: Snuff    Quit date: 03/23/2016  . Tobacco comment: started smoking at age 60  Substance Use Topics  . Alcohol use: No  Objective:   BP 100/60 (BP Location: Left Arm, Patient Position: Sitting, Cuff Size: Large)   Pulse 90   Temp 98.8 F (37.1 C) (Oral)   Resp 16   Wt 221 lb 9.6 oz (100.5 kg)   BMI 41.87 kg/m  Vitals:   01/18/19 1330  BP: 100/60  Pulse: 90  Resp: 16  Temp: 98.8 F (37.1 C)  TempSrc: Oral  Weight: 221 lb 9.6 oz (100.5 kg)     Physical Exam Vitals signs reviewed.  Constitutional:      General: She is not in acute distress.    Appearance: She is well-developed. She is obese. She is not ill-appearing or diaphoretic.  HENT:     Head: Normocephalic and atraumatic.     Right Ear: Hearing, ear canal and  external ear normal. A middle ear effusion (clear with some air bubbles located in the 4-6 o'clock position) is present.     Left Ear: Hearing, tympanic membrane, ear canal and external ear normal.     Nose: Nose normal.     Mouth/Throat:     Mouth: Mucous membranes are moist.     Pharynx: Uvula midline. No oropharyngeal exudate.  Eyes:     General: No scleral icterus.       Right eye: No discharge.        Left eye: No discharge.     Extraocular Movements: Extraocular movements intact.     Conjunctiva/sclera: Conjunctivae normal.     Pupils: Pupils are equal, round, and reactive to light.  Neck:     Musculoskeletal: Normal range of motion and neck supple.     Thyroid: No thyromegaly.     Trachea: No tracheal deviation.  Cardiovascular:     Rate and Rhythm: Normal rate and regular rhythm.     Heart sounds: Normal heart sounds. No murmur. No friction rub. No gallop.   Pulmonary:     Effort: Pulmonary effort is normal. No respiratory distress.     Breath sounds: Normal breath sounds. No stridor. No wheezing or rales.  Abdominal:     General: Abdomen is flat. There is no distension.     Palpations: There is no mass.     Tenderness: There is abdominal tenderness in the epigastric area. There is no right CVA tenderness or left CVA tenderness.     Hernia: No hernia is present.    Lymphadenopathy:     Cervical: No cervical adenopathy.  Skin:    General: Skin is warm and dry.  Neurological:     Mental Status: She is alert.         Assessment & Plan    1. Abdominal mass, LUQ (left upper quadrant) Will get US of LUQ to determine source of soft tissue mass. R/O ventral hernia vs lipoma.  - US Abdomen Limited; Future  2. Gastroesophageal reflux disease without esophagitis Worsening. Will start protonix as below. Call if not improving.  - pantoprazole (PROTONIX) 40 MG tablet; Take 1 tablet (40 mg total) by mouth daily.  Dispense: 90 tablet; Refill: 1  3. Chronic idiopathic  constipation Long standing history of CIC. Has failed OTC stool softener and Miralax. Samples of Linzess givenin office with Rx as below.  - linaclotide (LINZESS) 145 MCG CAPS capsule; Take 1 capsule (145 mcg total) by mouth daily before breakfast.  Dispense: 90 capsule; Refill: 1  4. Dysfunction of right eustachian tube Suspect ETD causing symptoms. Advised of flonase use. Instructed patient of Brandt-Daroff exercises and Epley Maneuver.  Mar Daring, PA-C  [facility]

## 2019-01-27 ENCOUNTER — Other Ambulatory Visit: Payer: Self-pay

## 2019-01-27 DIAGNOSIS — K219 Gastro-esophageal reflux disease without esophagitis: Secondary | ICD-10-CM

## 2019-01-27 NOTE — Telephone Encounter (Signed)
Patient advised that [place] was normal.Per Joycelyn Man  No soft tissue mass (lipoma) or hernia noted. Most likely muscles underlying. Patient asked "what are going to do about the pain"?  Please advise.

## 2019-01-27 NOTE — Telephone Encounter (Signed)
That is why we are trying the protonix x 2 weeks to see if it improves and the linzess for the constipation and bloating.

## 2019-01-27 NOTE — Telephone Encounter (Signed)
No answer - mailbox is full

## 2019-01-30 NOTE — Telephone Encounter (Signed)
Called pt but no answer mailbox is full.

## 2019-02-01 NOTE — Telephone Encounter (Signed)
Patient advised as below. Patient verbalizes understanding and is in agreement with treatment plan.  

## 2019-02-02 ENCOUNTER — Ambulatory Visit: Payer: BLUE CROSS/BLUE SHIELD

## 2019-02-27 ENCOUNTER — Telehealth: Payer: Self-pay | Admitting: Family Medicine

## 2019-02-27 NOTE — Telephone Encounter (Signed)
Patient was advised and has appointment schedule for 03/03/2019.

## 2019-02-27 NOTE — Telephone Encounter (Signed)
Pt called back to office to see if Dr. B would be able to fit her in her schedule tomorrow. Informed pt that she is only here in the morning tomorrow and that she didn't have any openings. Pt states she is trying to call her GYN office as well but not getting answer. Pt advised that someone would get back to her. Please advise.  Thank you, [facility]

## 2019-02-27 NOTE — Telephone Encounter (Signed)
Patient advised via another message. 

## 2019-02-27 NOTE — Telephone Encounter (Signed)
Pt has noticed in the last month after she has sex she is bleeding, not a lot and it is bright red.  Some discomfort in her uterine area.  CB#  [phone]    Nicole Wade

## 2019-02-27 NOTE — Telephone Encounter (Signed)
Either needs to see [place] or GYN for pelvic exam and possible testing.

## 2019-02-27 NOTE — Telephone Encounter (Signed)
I tried to schedule her an appt but she says she has a job and can only come in at very specific times.  Nicole Wade

## 2019-03-03 ENCOUNTER — Ambulatory Visit (INDEPENDENT_AMBULATORY_CARE_PROVIDER_SITE_OTHER): Payer: BLUE CROSS/BLUE SHIELD | Admitting: Family Medicine

## 2019-03-03 ENCOUNTER — Ambulatory Visit: Payer: Self-pay | Admitting: Family Medicine

## 2019-03-03 ENCOUNTER — Other Ambulatory Visit (HOSPITAL_COMMUNITY): Admission: RE | Admit: 2019-03-03 | Payer: BLUE CROSS/BLUE SHIELD | Source: Ambulatory Visit

## 2019-03-03 ENCOUNTER — Encounter: Payer: Self-pay | Admitting: Family Medicine

## 2019-03-03 ENCOUNTER — Other Ambulatory Visit: Payer: Self-pay

## 2019-03-03 VITALS — BP 131/90 | HR 98 | Temp 98.7°F | Wt 205.2 lb

## 2019-03-03 DIAGNOSIS — F419 Anxiety disorder, unspecified: Secondary | ICD-10-CM

## 2019-03-03 DIAGNOSIS — F909 Attention-deficit hyperactivity disorder, unspecified type: Secondary | ICD-10-CM

## 2019-03-03 DIAGNOSIS — K219 Gastro-esophageal reflux disease without esophagitis: Secondary | ICD-10-CM

## 2019-03-03 DIAGNOSIS — N760 Acute vaginitis: Secondary | ICD-10-CM | POA: Insufficient documentation

## 2019-03-03 DIAGNOSIS — Z8349 Family history of other endocrine, nutritional and metabolic diseases: Secondary | ICD-10-CM

## 2019-03-03 DIAGNOSIS — Z0000 Encounter for general adult medical examination without abnormal findings: Secondary | ICD-10-CM

## 2019-03-03 DIAGNOSIS — F429 Obsessive-compulsive disorder, unspecified: Secondary | ICD-10-CM

## 2019-03-03 DIAGNOSIS — F32A Depression, unspecified: Secondary | ICD-10-CM

## 2019-03-03 DIAGNOSIS — N938 Other specified abnormal uterine and vaginal bleeding: Secondary | ICD-10-CM | POA: Diagnosis not present

## 2019-03-03 DIAGNOSIS — N930 Postcoital and contact bleeding: Secondary | ICD-10-CM | POA: Diagnosis not present

## 2019-03-03 DIAGNOSIS — F410 Panic disorder [episodic paroxysmal anxiety] without agoraphobia: Secondary | ICD-10-CM

## 2019-03-03 DIAGNOSIS — E669 Obesity, unspecified: Secondary | ICD-10-CM

## 2019-03-03 DIAGNOSIS — Z124 Encounter for screening for malignant neoplasm of cervix: Secondary | ICD-10-CM

## 2019-03-03 DIAGNOSIS — F17200 Nicotine dependence, unspecified, uncomplicated: Secondary | ICD-10-CM | POA: Diagnosis not present

## 2019-03-03 DIAGNOSIS — F329 Major depressive disorder, single episode, unspecified: Secondary | ICD-10-CM

## 2019-03-03 MED ORDER — FLUCONAZOLE 150 MG PO TABS: 150.0000 mg | ORAL_TABLET | Freq: Once | ORAL | 0 refills | Status: AC

## 2019-03-03 NOTE — Assessment & Plan Note (Signed)
Well-controlled Followed by psychiatry No changes to medications 

## 2019-03-03 NOTE — Assessment & Plan Note (Signed)
Well-controlled Followed by psychiatry No changes to medications today

## 2019-03-03 NOTE — Assessment & Plan Note (Signed)
Resumed smoking recently 3 to 5-minute discussion regarding the importance of cessation and the health risks of ongoing smoking We have discussed the medication options as well as nicotine replacement to help with cessation She is also been given the quit line information in the past She remains pre-contemplative at this time, but we will continue to reassess and encourage cessation

## 2019-03-03 NOTE — Assessment & Plan Note (Signed)
New problem She has been having some irregular breakthrough bleeding that she did not previously have on her continuous OCPs Discussed that every 3 to 6 months she should take a week of the placebo pills to allow for bleeding and to avoid breakthrough bleeding in the future

## 2019-03-03 NOTE — Patient Instructions (Signed)
Preventive Care 21-29 Years Old, Female Preventive care refers to visits with your health care provider and lifestyle choices that can promote health and wellness. This includes:  A yearly physical exam. This may also be called an annual well check.  Regular dental visits and eye exams.  Immunizations.  Screening for certain conditions.  Healthy lifestyle choices, such as eating a healthy diet, getting regular exercise, not using drugs or products that contain nicotine and tobacco, and limiting alcohol use. What can I expect for my preventive care visit? Physical exam Your health care provider will check your:  Height and weight. This may be used to calculate body mass index (BMI), which tells if you are at a healthy weight.  Heart rate and blood pressure.  Skin for abnormal spots. Counseling Your health care provider may ask you questions about your:  Alcohol, tobacco, and drug use.  Emotional well-being.  Home and relationship well-being.  Sexual activity.  Eating habits.  Work and work environment.  Method of birth control.  Menstrual cycle.  Pregnancy history. What immunizations do I need?  Influenza (flu) vaccine  This is recommended every year. Tetanus, diphtheria, and pertussis (Tdap) vaccine  You may need a Td booster every 10 years. Varicella (chickenpox) vaccine  You may need this if you have not been vaccinated. Human papillomavirus (HPV) vaccine  If recommended by your health care provider, you may need three doses over 6 months. Measles, mumps, and rubella (MMR) vaccine  You may need at least one dose of MMR. You may also need a second dose. Meningococcal conjugate (MenACWY) vaccine  One dose is recommended if you are age 19-21 years and a first-year college student living in a residence hall, or if you have one of several medical conditions. You may also need additional booster doses. Pneumococcal conjugate (PCV13) vaccine  You may need  this if you have certain conditions and were not previously vaccinated. Pneumococcal polysaccharide (PPSV23) vaccine  You may need one or two doses if you smoke cigarettes or if you have certain conditions. Hepatitis A vaccine  You may need this if you have certain conditions or if you travel or work in places where you may be exposed to hepatitis A. Hepatitis B vaccine  You may need this if you have certain conditions or if you travel or work in places where you may be exposed to hepatitis B. Haemophilus influenzae type b (Hib) vaccine  You may need this if you have certain conditions. You may receive vaccines as individual doses or as more than one vaccine together in one shot (combination vaccines). Talk with your health care provider about the risks and benefits of combination vaccines. What tests do I need?  Blood tests  Lipid and cholesterol levels. These may be checked every 5 years starting at age 20.  Hepatitis C test.  Hepatitis B test. Screening  Diabetes screening. This is done by checking your blood sugar (glucose) after you have not eaten for a while (fasting).  Sexually transmitted disease (STD) testing.  BRCA-related cancer screening. This may be done if you have a family history of breast, ovarian, tubal, or peritoneal cancers.  Pelvic exam and Pap test. This may be done every 3 years starting at age 21. Starting at age 30, this may be done every 5 years if you have a Pap test in combination with an HPV test. Talk with your health care provider about your test results, treatment options, and if necessary, the need for more tests.   Follow these instructions at home: Eating and drinking   Eat a diet that includes fresh fruits and vegetables, whole grains, lean protein, and low-fat dairy.  Take vitamin and mineral supplements as recommended by your health care provider.  Do not drink alcohol if: ? Your health care provider tells you not to drink. ? You are  pregnant, may be pregnant, or are planning to become pregnant.  If you drink alcohol: ? Limit how much you have to 0-1 drink a day. ? Be aware of how much alcohol is in your drink. In the [place] one drink equals one 12 oz bottle of beer (355 mL), one 5 oz glass of wine (148 mL), or one 1 oz glass of hard liquor (44 mL). Lifestyle  Take daily care of your teeth and gums.  Stay active. Exercise for at least 30 minutes on 5 or more days each week.  Do not use any products that contain nicotine or tobacco, such as cigarettes, e-cigarettes, and chewing tobacco. If you need help quitting, ask your health care provider.  If you are sexually active, practice safe sex. Use a condom or other form of birth control (contraception) in order to prevent pregnancy and STIs (sexually transmitted infections). If you plan to become pregnant, see your health care provider for a preconception visit. What's next?  Visit your health care provider once a year for a well check visit.  Ask your health care provider how often you should have your eyes and teeth checked.  Stay up to date on all vaccines. This information is not intended to replace advice given to you by your health care provider. Make sure you discuss any questions you have with your health care provider. Document Released: 10/06/2001 Document Revised: 04/21/2018 Document Reviewed: 04/21/2018 Elsevier Patient Education  2020 [employer]  

## 2019-03-03 NOTE — Assessment & Plan Note (Signed)
Well-controlled Followed by psychiatry No changes to medications

## 2019-03-03 NOTE — Assessment & Plan Note (Signed)
Chronic and well controlled  Continue PPI 

## 2019-03-03 NOTE — Assessment & Plan Note (Signed)
New problem within the last 2 months Everything appears normal on exam with no lacerations or polyps Cervix does bleed minimally with Pap brush We will send Pap smear as well as STD testing If persists or worsens, consider GYN referral

## 2019-03-03 NOTE — Assessment & Plan Note (Signed)
Not followed by psychiatry Reports is well controlled No changes to medications today

## 2019-03-03 NOTE — Assessment & Plan Note (Signed)
New problem Associated with dyspareunia Appears to have vaginal candidiasis on exam today We will treat empirically with fluconazole Will test off of her Pap smear for STDs as well as BV and yeast Discussed return precautions

## 2019-03-03 NOTE — Assessment & Plan Note (Signed)
Congratulated patient on her weight loss Encouraged continuing exercise and diet Was previously referred to nutritionist No longer on phentermine

## 2019-03-03 NOTE — Assessment & Plan Note (Signed)
Patient is asymptomatic Recheck TSH annually

## 2019-03-03 NOTE — Assessment & Plan Note (Signed)
Now followed by psychiatry Doing well on Concerta No changes to medications

## 2019-03-03 NOTE — Progress Notes (Signed)
Patient: Nicole Wade, Female    DOB: Jul 13, 1991, 28 y.o.   MRN: [id number] Visit Date: 03/03/2019  Today's Provider: Lavon Paganini, MD   Chief Complaint  Patient presents with  . Annual Exam   Subjective:    Annual physical exam Nicole Wade is a 28 y.o. female who presents today for health maintenance and complete physical. She feels well. She reports exercising more with kayaking. She reports she is sleeping well.   Vaginal bleeding after sex for 2 months Cramping last week after sex LMP 1 month ago and lasted ~2 wks  Taking continuous birth control with no break Some dyspareunia.  Some vaginal discharge ----------------------------------------------------------------- Thinks she may have had pap smear last year, but last one recorded in GYN records that wrere faxed was 2016.  Reports abnormal pap smear ~7-8 years ago with negaitve biopsy  Quit smoking, but after going on unemployment started smoking again Not ready to quit yet  Seeing psychiatry and feels like her anxiety is better controlled than it has been previously  Reports that she is exercising more and has been able to lose about 25 pounds.  She is very proud of this.  Review of Systems  Constitutional: Negative.   HENT: Negative.   Eyes: Negative.   Respiratory: Negative.   Cardiovascular: Negative.   Gastrointestinal: Negative.   Endocrine: Negative.   Genitourinary: Positive for vaginal bleeding and vaginal pain.  Musculoskeletal: Positive for back pain.  Skin: Negative.   Allergic/Immunologic: Negative.   Neurological: Negative.   Hematological: Negative.   Psychiatric/Behavioral: Negative.     Social History She  reports that she quit smoking about 5 months ago. Her smoking use included e-cigarettes. She quit after 11.00 years of use. She quit smokeless tobacco use about 2 years ago.  Her smokeless tobacco use included snuff. She reports current drug use. Drug:  Marijuana. She reports that she does not drink alcohol. Social History   Socioeconomic History  . Marital status: Single    Spouse name: Not on file  . Number of children: 0  . Years of education: bachelor's  . Highest education level: Not on file  Occupational History  . Occupation: Art therapist: [employer]  . Financial resource strain: Not on file  . Food insecurity    Worry: Not on file    Inability: Not on file  . Transportation needs    Medical: Not on file    Non-medical: Not on file  Tobacco Use  . Smoking status: Former Smoker    Years: 11.00    Types: E-cigarettes    Quit date: 09/27/2018    Years since quitting: 0.4  . Smokeless tobacco: Former Systems developer    Types: Snuff    Quit date: 03/23/2016  . Tobacco comment: started smoking at age 67  Substance and Sexual Activity  . Alcohol use: No  . Drug use: Yes    Types: Marijuana    Comment: last smoked about 10 days ago  . Sexual activity: Not Currently  Lifestyle  . Physical activity    Days per week: Not on file    Minutes per session: Not on file  . Stress: Not on file  Relationships  . Social Herbalist on phone: Not on file    Gets together: Not on file    Attends religious service: Not on file    Active member of club or organization: Not  on file    Attends meetings of clubs or organizations: Not on file    Relationship status: Not on file  Other Topics Concern  . Not on file  Social History Narrative  . Not on file    Patient Active Problem List   Diagnosis Date Noted  . Tobacco use disorder 09/21/2018  . Morbid obesity ([place]) 09/09/2017  . Gastroesophageal reflux disease 05/27/2017  . Family history of thyroid disease 05/25/2017  . Panic disorder   . OCD (obsessive compulsive disorder)   . Depression   . Anxiety   . Allergic rhinitis   . ADHD     Past Surgical History:  Procedure Laterality Date  . ESOPHAGOGASTRODUODENOSCOPY (EGD) WITH PROPOFOL  N/A 08/04/2018   Procedure: ESOPHAGOGASTRODUODENOSCOPY (EGD) WITH PROPOFOL;  Surgeon: Jonathon Bellows, MD;  Location: [facility] ENDOSCOPY;  Service: Gastroenterology;  Laterality: N/A;  . TOOTH EXTRACTION  2010   wisdom teeth    Family History  Family Status  Relation Name Status  . Mother  Alive  . Father  Alive  . Brother  Alive  . MGF  Deceased  . PGF  Alive  . Ethlyn Daniels  (Not Specified)  . Neg Hx  (Not Specified)   Her family history includes Alcohol abuse in her mother; Anxiety disorder in her brother, father, and mother; Breast cancer in her paternal aunt; CVA (age of onset: 32) in her paternal grandfather; Depression in her brother, father, and mother; Diabetes in her paternal grandfather; Heart attack (age of onset: 29) in her paternal grandfather; Heart attack (age of onset: 20) in her maternal grandfather; Hypertension in her father; Hypothyroidism in her mother.     Allergies  Allergen Reactions  . Azithromycin Hives  . Penicillins Hives  . Sulfa Antibiotics Hives  . Prednisone Palpitations    Caused mental distress    Previous Medications   ALBUTEROL (PROVENTIL HFA;VENTOLIN HFA) 108 (90 BASE) MCG/ACT INHALER    Inhale 2 puffs into the lungs every 6 (six) hours as needed for wheezing or shortness of breath.   CLONAZEPAM (KLONOPIN) 1 MG TABLET    Take 0.5-1 tablets (0.5-1 mg total) by mouth 2 (two) times daily as needed for anxiety.   CONCERTA 18 MG CR TABLET    Take 18 mg by mouth daily.   FLUCONAZOLE (DIFLUCAN) 150 MG TABLET    Take one pill on day 1. Take second pill 3 days later if symptoms persist.   LAMOTRIGINE (LAMICTAL) 200 MG TABLET    Take 200 mg by mouth daily.    LINACLOTIDE (LINZESS) 145 MCG CAPS CAPSULE    Take 1 capsule (145 mcg total) by mouth daily before breakfast.   MILI 0.25-35 MG-MCG TABLET    Take 1 tablet by mouth daily. Skip placebo pills, take continuously   PANTOPRAZOLE (PROTONIX) 40 MG TABLET    Take 1 tablet (40 mg total) by mouth daily.   UNABLE TO  FIND    Med Name: L Methyldopate 15 mg   VILAZODONE HCL (VIIBRYD) 10 MG TABS    Take by mouth daily.    Patient Care Team: Virginia Crews, MD as PCP - General (Family Medicine)      Objective:   Vitals: BP 131/90 (BP Location: Left Arm, Patient Position: Sitting, Cuff Size: Normal)   Pulse 98   Temp 98.7 F (37.1 C) (Oral)   Wt 205 lb 3.2 oz (93.1 kg)   SpO2 96%   BMI 38.77 kg/m    Physical Exam Vitals signs  reviewed.  Constitutional:      General: She is not in acute distress.    Appearance: Normal appearance. She is well-developed. She is not diaphoretic.  HENT:     Head: Normocephalic and atraumatic.     Right Ear: Tympanic membrane, ear canal and external ear normal.     Left Ear: Tympanic membrane, ear canal and external ear normal.  Eyes:     General: No scleral icterus.    Extraocular Movements: Extraocular movements intact.     Conjunctiva/sclera: Conjunctivae normal.     Pupils: Pupils are equal, round, and reactive to light.  Neck:     Musculoskeletal: Neck supple.     Thyroid: No thyromegaly.  Cardiovascular:     Rate and Rhythm: Normal rate and regular rhythm.     Pulses: Normal pulses.     Heart sounds: Normal heart sounds. No murmur.  Pulmonary:     Effort: Pulmonary effort is normal. No respiratory distress.     Breath sounds: Normal breath sounds. No wheezing or rales.  Abdominal:     General: Bowel sounds are normal. There is no distension.     Palpations: Abdomen is soft.     Tenderness: There is no abdominal tenderness. There is no guarding or rebound.  Genitourinary:    Comments: GYN:  External genitalia within normal limits.  Vaginal mucosa pink, moist, normal rugae.  Nonfriable cervix without lesions. Cervix does bleed minimally with pap smear collection.  Thick, white discharge noted. Bimanual exam revealed normal, nongravid uterus.  No cervical motion tenderness. No adnexal masses bilaterally.    Musculoskeletal:        General: No  deformity.     Right lower leg: No edema.     Left lower leg: No edema.  Lymphadenopathy:     Cervical: No cervical adenopathy.  Skin:    General: Skin is warm and dry.     Capillary Refill: Capillary refill takes less than 2 seconds.     Findings: No rash.  Neurological:     Mental Status: She is alert and oriented to person, place, and time. Mental status is at baseline.  Psychiatric:        Mood and Affect: Mood normal.        Behavior: Behavior normal.        Thought Content: Thought content normal.      Depression Screen PHQ 2/9 Scores 10/11/2018 04/19/2018 04/05/2018 09/09/2017  PHQ - 2 Score 6 1 1 6   PHQ- 9 Score - 7 8 21       Assessment & Plan:     Routine Health Maintenance and Physical Exam  Exercise Activities and Dietary recommendations Goals   None     Immunization History  Administered Date(s) Administered  . DTaP 06/10/1990, 08/19/1990, 10/14/1990, 07/28/1991, 02/05/1995  . Hepatitis B 03/21/2004, 04/24/2004, 11/21/2004  . HiB (PRP-OMP) 06/10/1990, 08/19/1990, 10/14/1990, 07/28/1991  . IPV 06/10/1990, 08/19/1990, 07/28/1991, 02/05/1995  . MMR 07/28/1991, 02/05/1995  . Tdap 04/19/2018    Health Maintenance  Topic Date Due  . PAP-Cervical Cytology Screening  04/04/2018  . PAP SMEAR-Modifier  04/04/2018  . INFLUENZA VACCINE  03/25/2019  . TETANUS/TDAP  04/19/2028  . HIV Screening  Completed     Discussed health benefits of physical activity, and encouraged her to engage in regular exercise appropriate for her age and condition.    --------------------------------------------------------------------  Problem List Items Addressed This Visit      Digestive   Gastroesophageal reflux disease  Chronic and well-controlled Continue PPI        Genitourinary   DUB (dysfunctional uterine bleeding)    New problem She has been having some irregular breakthrough bleeding that she did not previously have on her continuous OCPs Discussed that every  3 to 6 months she should take a week of the placebo pills to allow for bleeding and to avoid breakthrough bleeding in the future      Acute vaginitis    New problem Associated with dyspareunia Appears to have vaginal candidiasis on exam today We will treat empirically with fluconazole Will test off of her Pap smear for STDs as well as BV and yeast Discussed return precautions        Other   Panic disorder    Not followed by psychiatry Reports is well controlled No changes to medications today      OCD (obsessive compulsive disorder)    Well-controlled Followed by psychiatry No changes to medications today      Depression    Well-controlled Followed by psychiatry No changes to medications      Anxiety    Well-controlled Followed by psychiatry No changes to medications      ADHD    Now followed by psychiatry Doing well on Concerta No changes to medications      Family history of thyroid disease    Patient is asymptomatic Recheck TSH annually      Relevant Orders   TSH   Obesity (BMI 30-39.9)    Congratulated patient on her weight loss Encouraged continuing exercise and diet Was previously referred to nutritionist No longer on phentermine      Tobacco use disorder    Resumed smoking recently 3 to 5-minute discussion regarding the importance of cessation and the health risks of ongoing smoking We have discussed the medication options as well as nicotine replacement to help with cessation She is also been given the quit line information in the past She remains pre-contemplative at this time, but we will continue to reassess and encourage cessation      PCB (post coital bleeding)    New problem within the last 2 months Everything appears normal on exam with no lacerations or polyps Cervix does bleed minimally with Pap brush We will send Pap smear as well as STD testing If persists or worsens, consider GYN referral      Relevant Orders   Cytology -  PAP    Other Visit Diagnoses    Encounter for annual physical exam    -  Primary   Relevant Orders   CBC with Differential/Platelet   Comprehensive metabolic panel   Cytology - PAP   Lipid panel   TSH   Screening for cervical cancer       Relevant Orders   Cytology - PAP       Return in about 1 year (around 03/02/2020) for CPE.   The entirety of the information documented in the History of Present Illness, Review of Systems and Physical Exam were personally obtained by me. Portions of this information were initially documented by [facility], CMA and reviewed by me for thoroughness and accuracy.    Ja Ohman, Dionne Bucy, MD MPH [facility]

## 2019-03-04 LAB — CBC WITH DIFFERENTIAL/PLATELET
Basophils Absolute: 0.1 10*3/uL (ref 0.0–0.2)
Basos: 1 %
EOS (ABSOLUTE): 0 10*3/uL (ref 0.0–0.4)
Eos: 0 %
Hematocrit: 37.1 % (ref 34.0–46.6)
Hemoglobin: 12.7 g/dL (ref 11.1–15.9)
Immature Grans (Abs): 0 10*3/uL (ref 0.0–0.1)
Immature Granulocytes: 0 %
Lymphocytes Absolute: 2.4 10*3/uL (ref 0.7–3.1)
Lymphs: 23 %
MCH: 29.5 pg (ref 26.6–33.0)
MCHC: 34.2 g/dL (ref 31.5–35.7)
MCV: 86 fL (ref 79–97)
Monocytes Absolute: 0.5 10*3/uL (ref 0.1–0.9)
Monocytes: 5 %
Neutrophils Absolute: 7.6 10*3/uL — ABNORMAL HIGH (ref 1.4–7.0)
Neutrophils: 71 %
Platelets: 300 10*3/uL (ref 150–450)
RBC: 4.3 x10E6/uL (ref 3.77–5.28)
RDW: 12.9 % (ref 11.7–15.4)
WBC: 10.6 10*3/uL (ref 3.4–10.8)

## 2019-03-04 LAB — COMPREHENSIVE METABOLIC PANEL
ALT: 11 IU/L (ref 0–32)
AST: 9 IU/L (ref 0–40)
Albumin/Globulin Ratio: 2 (ref 1.2–2.2)
Albumin: 4.3 g/dL (ref 3.9–5.0)
Alkaline Phosphatase: 52 IU/L (ref 39–117)
BUN/Creatinine Ratio: 12 (ref 9–23)
BUN: 11 mg/dL (ref 6–20)
Bilirubin Total: <0.2 mg/dL (ref 0.0–1.2)
CO2: 19 mmol/L — ABNORMAL LOW (ref 20–29)
Calcium: 9.4 mg/dL (ref 8.7–10.2)
Chloride: 103 mmol/L (ref 96–106)
Creatinine, Ser: 0.95 mg/dL (ref 0.57–1.00)
GFR calc Af Amer: 94 mL/min/{1.73_m2} (ref >59)
GFR calc non Af Amer: 82 mL/min/{1.73_m2} (ref >59)
Globulin, Total: 2.2 g/dL (ref 1.5–4.5)
Glucose: 89 mg/dL (ref 65–99)
Potassium: 4.5 mmol/L (ref 3.5–5.2)
Sodium: 141 mmol/L (ref 134–144)
Total Protein: 6.5 g/dL (ref 6.0–8.5)

## 2019-03-04 LAB — LIPID PANEL
Chol/HDL Ratio: 3 ratio (ref 0.0–4.4)
Cholesterol, Total: 140 mg/dL (ref 100–199)
HDL: 47 mg/dL (ref >39)
LDL Calculated: 75 mg/dL (ref 0–99)
Triglycerides: 90 mg/dL (ref 0–149)
VLDL Cholesterol Cal: 18 mg/dL (ref 5–40)

## 2019-03-04 LAB — TSH: TSH: 0.98 u[IU]/mL (ref 0.450–4.500)

## 2019-03-07 LAB — CYTOLOGY - PAP
Chlamydia: NEGATIVE
Diagnosis: NEGATIVE
Neisseria Gonorrhea: NEGATIVE
Trichomonas: NEGATIVE

## 2019-03-09 ENCOUNTER — Telehealth: Payer: Self-pay | Admitting: Family Medicine

## 2019-03-09 NOTE — Telephone Encounter (Signed)
Patient was advised and the message in patient chart states she was advised via mychart.

## 2019-03-09 NOTE — Telephone Encounter (Signed)
Pt ws in last Friday and had a pap.  She has not heard form the results  CB#  [phone]  [facility]

## 2019-03-23 ENCOUNTER — Telehealth: Payer: Self-pay | Admitting: Family Medicine

## 2019-03-23 NOTE — Telephone Encounter (Signed)
Pt called saying she needs a refill on Diflucan Pills  She still has the yeast infection  [facility] in [place] [address]  CB#  [phone]  ter[place]

## 2019-03-23 NOTE — Telephone Encounter (Signed)
Please advise 

## 2019-03-24 MED ORDER — TERCONAZOLE 0.4 % VA CREA: 1.0000 | TOPICAL_CREAM | Freq: Every day | VAGINAL | 0 refills | Status: AC

## 2019-03-24 NOTE — Telephone Encounter (Signed)
Sent in Rx for terconazole 7 day treatment.  This should work better

## 2019-03-24 NOTE — Telephone Encounter (Signed)
Patient advised.

## 2019-04-10 ENCOUNTER — Ambulatory Visit: Payer: BLUE CROSS/BLUE SHIELD | Admitting: Physician Assistant

## 2019-04-10 ENCOUNTER — Other Ambulatory Visit: Payer: Self-pay

## 2019-04-10 DIAGNOSIS — Z20822 Contact with and (suspected) exposure to covid-19: Secondary | ICD-10-CM

## 2019-04-11 LAB — NOVEL CORONAVIRUS, NAA: SARS-CoV-2, NAA: NOT DETECTED

## 2019-04-21 ENCOUNTER — Encounter: Payer: BLUE CROSS/BLUE SHIELD | Admitting: Family Medicine

## 2019-05-30 NOTE — Progress Notes (Signed)
Patient: Nicole Wade Female    DOB: 22-Sep-1990   28 y.o.   MRN: [id number] Visit Date: 05/31/2019  Today's Provider: Shirlee Latch, MD   Chief Complaint  Patient presents with  . Dyspareunia   Subjective:    HPI Patient presents today for painful urination, cramps and pain during intercourse. Patient has been having pain for 3 days.  PCB resolved Pain is dull ache and located in pelvis (bilateral) with radiation to back and upper abd She is very worried about what is wrong with her and has been feeling anxious about it She states that after being diagnosed with yeast infection 3 months ago, she took diflucan and used terazole cream as prescribed, but she also tried several OTC remedies  Continues to have vaginal discharge No urinary urgency/frequency, hematuria, flank pain.  She states that she has an ingrown hair on her labia as well that seem to pop and drain this morning  Allergies  Allergen Reactions  . Azithromycin Hives  . Penicillins Hives  . Sulfa Antibiotics Hives  . Prednisone Palpitations    Caused mental distress     Current Outpatient Medications:  .  albuterol (PROVENTIL HFA;VENTOLIN HFA) 108 (90 Base) MCG/ACT inhaler, Inhale 2 puffs into the lungs every 6 (six) hours as needed for wheezing or shortness of breath., Disp: 1 Inhaler, Rfl: 2 .  clonazePAM (KLONOPIN) 1 MG tablet, Take 0.5-1 tablets (0.5-1 mg total) by mouth 2 (two) times daily as needed for anxiety., Disp: 45 tablet, Rfl: 2 .  lamoTRIgine (LAMICTAL) 200 MG tablet, Take 200 mg by mouth daily. , Disp: , Rfl: 0 .  pantoprazole (PROTONIX) 40 MG tablet, Take 1 tablet (40 mg total) by mouth daily., Disp: 90 tablet, Rfl: 1 .  UNABLE TO FIND, Med Name: L Methyldopate 15 mg, Disp: , Rfl:  .  CONCERTA 18 MG CR tablet, Take 18 mg by mouth daily., Disp: , Rfl:  .  doxycycline (VIBRA-TABS) 100 MG tablet, Take 1 tablet (100 mg total) by mouth 2 (two) times daily for 7 days., Disp: 14  tablet, Rfl: 0 .  fluconazole (DIFLUCAN) 150 MG tablet, Take 1 tablet (150 mg total) by mouth once for 1 dose. May repeat in 3 days if not resolved, Disp: 2 tablet, Rfl: 1 .  linaclotide (LINZESS) 145 MCG CAPS capsule, Take 1 capsule (145 mcg total) by mouth daily before breakfast. (Patient not taking: Reported on 05/31/2019), Disp: 90 capsule, Rfl: 1 .  MILI 0.25-35 MG-MCG tablet, Take 1 tablet by mouth daily. Skip placebo pills, take continuously, Disp: 4 Package, Rfl: 3 .  Vilazodone HCl (VIIBRYD) 10 MG TABS, Take by mouth daily., Disp: , Rfl:   Review of Systems  Constitutional: Negative for appetite change, chills, fatigue and fever.  Respiratory: Negative for chest tightness and shortness of breath.   Cardiovascular: Negative for chest pain and palpitations.  Gastrointestinal: Negative for abdominal pain, nausea and vomiting.  Genitourinary: Positive for dyspareunia, dysuria, genital sores, pelvic pain, vaginal discharge and vaginal pain. Negative for decreased urine volume, difficulty urinating, flank pain, frequency, hematuria, urgency and vaginal bleeding.  Musculoskeletal: Negative.   Neurological: Negative for dizziness and weakness.  Psychiatric/Behavioral: The patient is nervous/anxious.     Social History   Tobacco Use  . Smoking status: Former Smoker    Years: 11.00    Types: E-cigarettes    Quit date: 09/27/2018    Years since quitting: 0.6  . Smokeless tobacco: Former Neurosurgeon  Types: Snuff    Quit date: 03/23/2016  . Tobacco comment: started smoking at age 28  Substance Use Topics  . Alcohol use: No      Objective:   BP 113/78 (BP Location: Left Arm, Patient Position: Sitting, Cuff Size: Large)   Pulse (!) 102   Temp (!) 97.3 F (36.3 C) (Other (Comment))   Resp 16   Wt 205 lb (93 kg)   SpO2 98%   BMI 38.73 kg/m  Vitals:   05/31/19 1051  BP: 113/78  Pulse: (!) 102  Resp: 16  Temp: (!) 97.3 F (36.3 C)  TempSrc: Other (Comment)  SpO2: 98%  Weight: 205  lb (93 kg)  Body mass index is 38.73 kg/m.   Physical Exam Constitutional:      General: She is not in acute distress.    Appearance: Normal appearance. She is not diaphoretic.  HENT:     Head: Normocephalic and atraumatic.  Eyes:     General: No scleral icterus.    Conjunctiva/sclera: Conjunctivae normal.  Cardiovascular:     Rate and Rhythm: Normal rate and regular rhythm.     Pulses: Normal pulses.     Heart sounds: Normal heart sounds. No murmur.  Pulmonary:     Effort: Pulmonary effort is normal. No respiratory distress.     Breath sounds: Normal breath sounds. No wheezing.  Abdominal:     General: Bowel sounds are normal. There is no distension.     Palpations: Abdomen is soft.     Tenderness: There is abdominal tenderness (diffusely, worse in pelvis). There is no right CVA tenderness, left CVA tenderness, guarding or rebound.  Genitourinary:    Comments: GYN:  External genitalia with small abscess on left upper labia majora.  Vaginal mucosa pink, moist, normal rugae.  Nonfriable cervix without lesions, no bleeding noted on speculum exam.  + white thick discharge. Bimanual exam revealed normal, nongravid uterus.  No cervical motion tenderness. No adnexal masses bilaterally.    Musculoskeletal:     Right lower leg: No edema.     Left lower leg: No edema.  Skin:    General: Skin is warm and dry.     Capillary Refill: Capillary refill takes less than 2 seconds.  Neurological:     Mental Status: She is alert and oriented to person, place, and time. Mental status is at baseline.  Psychiatric:        Mood and Affect: Mood is anxious.        Behavior: Behavior normal.      Results for orders placed or performed in visit on 05/31/19  POCT urinalysis dipstick  Result Value Ref Range   Color, UA Yellow    Clarity, UA Clear    Glucose, UA Negative Negative   Bilirubin, UA Negative    Ketones, UA Negative    Spec Grav, UA 1.020 1.010 - 1.025   Blood, UA Moderate    pH,  UA 5.0 5.0 - 8.0   Protein, UA Negative Negative   Urobilinogen, UA 0.2 0.2 or 1.0 E.U./dL   Nitrite, UA Negative    Leukocytes, UA Negative Negative   Appearance     Odor         Assessment & Plan   1. Dyspareunia in female 2. Possible urinary tract infection 3. Labial abscess 4. Pelvic cramping 5. Vaginal discharge -New problem x3 days -Vaginal discharge looks consistent with candidal vaginitis, so we will start empiric treatment with fluconazole - Labial abscess appears  to be draining and does not require I&D, but will treat with doxycycline x7 days - Dyspareunia is likely secondary to yeast infection and pelvic cramping may be caused by this as well -Patient does not seem to have PID, but will check for gonorrhea and chlamydia -Doxycycline would treat this even if she did have it - Suspect that her dysuria is related to her labial abscess and yeast vaginitis, but UA shows some blood, so we will send off urine micro and culture -Discussed return precautions -Discussed that if pain localizes would consider imaging at that time - Urine Culture - Urine Microscopic - Cervicovaginal ancillary only    Meds ordered this encounter  Medications  . doxycycline (VIBRA-TABS) 100 MG tablet    Sig: Take 1 tablet (100 mg total) by mouth 2 (two) times daily for 7 days.    Dispense:  14 tablet    Refill:  0  . fluconazole (DIFLUCAN) 150 MG tablet    Sig: Take 1 tablet (150 mg total) by mouth once for 1 dose. May repeat in 3 days if not resolved    Dispense:  2 tablet    Refill:  1  . MILI 0.25-35 MG-MCG tablet    Sig: Take 1 tablet by mouth daily. Skip placebo pills, take continuously    Dispense:  4 Package    Refill:  3     Return if symptoms worsen or fail to improve.   The entirety of the information documented in the History of Present Illness, Review of Systems and Physical Exam were personally obtained by me. Portions of this information were initially documented by April  Miller, CMA and reviewed by me for thoroughness and accuracy.    Sohrab Keelan, Dionne Bucy, MD MPH [facility]

## 2019-05-31 ENCOUNTER — Other Ambulatory Visit: Payer: Self-pay | Admitting: Family Medicine

## 2019-05-31 ENCOUNTER — Inpatient Hospital Stay (HOSPITAL_COMMUNITY): Admit: 2019-05-31 | Payer: BLUE CROSS/BLUE SHIELD

## 2019-05-31 ENCOUNTER — Ambulatory Visit: Payer: BLUE CROSS/BLUE SHIELD | Admitting: Family Medicine

## 2019-05-31 ENCOUNTER — Encounter: Payer: Self-pay | Admitting: Family Medicine

## 2019-05-31 ENCOUNTER — Encounter

## 2019-05-31 ENCOUNTER — Other Ambulatory Visit: Payer: Self-pay

## 2019-05-31 VITALS — BP 113/78 | HR 102 | Temp 97.3°F | Resp 16 | Wt 205.0 lb

## 2019-05-31 DIAGNOSIS — R3989 Other symptoms and signs involving the genitourinary system: Secondary | ICD-10-CM | POA: Diagnosis not present

## 2019-05-31 DIAGNOSIS — N764 Abscess of vulva: Secondary | ICD-10-CM | POA: Diagnosis not present

## 2019-05-31 DIAGNOSIS — N941 Unspecified dyspareunia: Secondary | ICD-10-CM

## 2019-05-31 DIAGNOSIS — R102 Pelvic and perineal pain: Secondary | ICD-10-CM | POA: Diagnosis not present

## 2019-05-31 DIAGNOSIS — N898 Other specified noninflammatory disorders of vagina: Secondary | ICD-10-CM

## 2019-05-31 LAB — POCT URINALYSIS DIPSTICK
Bilirubin, UA: NEGATIVE
Glucose, UA: NEGATIVE
Ketones, UA: NEGATIVE
Leukocytes, UA: NEGATIVE
Nitrite, UA: NEGATIVE
Protein, UA: NEGATIVE
Spec Grav, UA: 1.02 (ref 1.010–1.025)
Urobilinogen, UA: 0.2 E.U./dL
pH, UA: 5 (ref 5.0–8.0)

## 2019-05-31 MED ORDER — FLUCONAZOLE 150 MG PO TABS: 150.0000 mg | ORAL_TABLET | Freq: Once | ORAL | 1 refills | Status: AC

## 2019-05-31 MED ORDER — MILI 0.25-35 MG-MCG PO TABS: 1.0000 | ORAL_TABLET | Freq: Every day | ORAL | 3 refills | Status: DC

## 2019-05-31 MED ORDER — DOXYCYCLINE HYCLATE 100 MG PO TABS: 100.0000 mg | ORAL_TABLET | Freq: Two times a day (BID) | ORAL | 0 refills | Status: AC

## 2019-05-31 NOTE — Telephone Encounter (Signed)
Please advise 

## 2019-05-31 NOTE — Patient Instructions (Signed)
Vaginal Yeast infection, Adult  Vaginal yeast infection is a condition that causes vaginal discharge as well as soreness, swelling, and redness (inflammation) of the vagina. This is a common condition. Some women get this infection frequently. What are the causes? This condition is caused by a change in the normal balance of the yeast (candida) and bacteria that live in the vagina. This change causes an overgrowth of yeast, which causes the inflammation. What increases the risk? The condition is more likely to develop in women who:  Take antibiotic medicines.  Have diabetes.  Take birth control pills.  Are pregnant.  Douche often.  Have a weak body defense system (immune system).  Have been taking steroid medicines for a long time.  Frequently wear tight clothing. What are the signs or symptoms? Symptoms of this condition include:  White, thick, creamy vaginal discharge.  Swelling, itching, redness, and irritation of the vagina. The lips of the vagina (vulva) may be affected as well.  Pain or a burning feeling while urinating.  Pain during sex. How is this diagnosed? This condition is diagnosed based on:  Your medical history.  A physical exam.  A pelvic exam. Your health care provider will examine a sample of your vaginal discharge under a microscope. Your health care provider may send this sample for testing to confirm the diagnosis. How is this treated? This condition is treated with medicine. Medicines may be over-the-counter or prescription. You may be told to use one or more of the following:  Medicine that is taken by mouth (orally).  Medicine that is applied as a cream (topically).  Medicine that is inserted directly into the vagina (suppository). Follow these instructions at home:  Lifestyle  Do not have sex until your health care provider approves. Tell your sex partner that you have a yeast infection. That person should go to his or her health care  provider and ask if they should also be treated.  Do not wear tight clothes, such as pantyhose or tight pants.  Wear breathable cotton underwear. General instructions  Take or apply over-the-counter and prescription medicines only as told by your health care provider.  Eat more yogurt. This may help to keep your yeast infection from returning.  Do not use tampons until your health care provider approves.  Try taking a sitz bath to help with discomfort. This is a warm water bath that is taken while you are sitting down. The water should only come up to your hips and should cover your buttocks. Do this 3-4 times per day or as told by your health care provider.  Do not douche.  If you have diabetes, keep your blood sugar levels under control.  Keep all follow-up visits as told by your health care provider. This is important. Contact a health care provider if:  You have a fever.  Your symptoms go away and then return.  Your symptoms do not get better with treatment.  Your symptoms get worse.  You have new symptoms.  You develop blisters in or around your vagina.  You have blood coming from your vagina and it is not your menstrual period.  You develop pain in your abdomen. Summary  Vaginal yeast infection is a condition that causes discharge as well as soreness, swelling, and redness (inflammation) of the vagina.  This condition is treated with medicine. Medicines may be over-the-counter or prescription.  Take or apply over-the-counter and prescription medicines only as told by your health care provider.  Do not douche.   Do not have sex or use tampons until your health care provider approves.  Contact a health care provider if your symptoms do not get better with treatment or your symptoms go away and then return. This information is not intended to replace advice given to you by your health care provider. Make sure you discuss any questions you have with your health care  provider. Document Released: 05/20/2005 Document Revised: 12/27/2017 Document Reviewed: 12/27/2017 Elsevier Patient Education  2020 [employer]  

## 2019-06-01 ENCOUNTER — Telehealth: Payer: Self-pay

## 2019-06-01 LAB — URINALYSIS, MICROSCOPIC ONLY
Bacteria, UA: NONE SEEN
Casts: NONE SEEN /lpf
RBC, Urine: NONE SEEN /hpf (ref 0–2)

## 2019-06-01 NOTE — Telephone Encounter (Signed)
Pt advised.   Thanks,   Braulio Conte

## 2019-06-01 NOTE — Telephone Encounter (Signed)
-----   Message from Virginia Crews, MD sent at 06/01/2019  1:20 PM EDT ----- No true blood on UA on microscopic analysis.  Swab pending.

## 2019-06-02 LAB — URINE CULTURE

## 2019-06-08 LAB — CERVICOVAGINAL ANCILLARY ONLY
Bacterial Vaginitis (gardnerella): NEGATIVE
Candida Glabrata: NEGATIVE
Candida Vaginitis: NEGATIVE
Chlamydia: NEGATIVE
Comment: NEGATIVE
Comment: NEGATIVE
Comment: NEGATIVE
Comment: NEGATIVE
Comment: NEGATIVE
Comment: NORMAL
Neisseria Gonorrhea: NEGATIVE
Trichomonas: NEGATIVE

## 2019-08-23 ENCOUNTER — Telehealth: Payer: Self-pay

## 2019-08-23 NOTE — Telephone Encounter (Signed)
Schedule telephone visit for medication changes. Thanks.

## 2019-08-23 NOTE — Telephone Encounter (Signed)
Copied from [place] [phone]. Topic: General - Inquiry >> Aug 23, 2019 11:22 AM Mathis Bud wrote: Reason for CRM: Patient psychologist is requesting patient to get on YAZ birth control.  Patient is requesting to speak to nurse regarding this.  Patient states if PCP just calls in medication she would like medication sent to pharmacy in [place], [place]  Phone:  [phone] Fax:  [phone]   Patient call back [phone]

## 2019-08-24 NOTE — Telephone Encounter (Signed)
Patient states that she could not schedule appointment due to not having any insurance after 08/24/2019. Please advise.

## 2019-08-28 NOTE — Telephone Encounter (Signed)
Patient advised. She states her psychologist is weaning her off antidepressives and states Yaz would help with mood swings. Patient advised Dianah Field carries a higher risk of blood clots since she is still vaping. Patient declined Yaz.

## 2019-08-28 NOTE — Telephone Encounter (Signed)
I don't know why psychologist is recommending on contraceptives.  If she is still smoking or vaping, I wouldn't recommend Yaz as it does carry a higher risk of blood clots than the other OCPs.  I need more information.  Will try to help without OV, but typically this is needed for medication changes

## 2020-03-08 ENCOUNTER — Encounter: Payer: Self-pay | Admitting: Family Medicine

## 2020-08-13 ENCOUNTER — Other Ambulatory Visit: Payer: Self-pay | Admitting: Family Medicine
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
